# Patient Record
Sex: Female | Born: 1975 | Hispanic: No | Marital: Married | Smoking: Never smoker
Health system: Southern US, Community
[De-identification: ages and names within clinical notes are randomized; demographics above are authoritative.]

## PROBLEM LIST (undated history)

## (undated) DIAGNOSIS — R200 Anesthesia of skin: Secondary | ICD-10-CM

## (undated) DIAGNOSIS — R42 Dizziness and giddiness: Secondary | ICD-10-CM

## (undated) DIAGNOSIS — F32A Depression, unspecified: Secondary | ICD-10-CM

## (undated) DIAGNOSIS — R202 Paresthesia of skin: Secondary | ICD-10-CM

## (undated) DIAGNOSIS — R55 Syncope and collapse: Principal | ICD-10-CM

## (undated) DIAGNOSIS — E230 Hypopituitarism: Secondary | ICD-10-CM

## (undated) DIAGNOSIS — I959 Hypotension, unspecified: Secondary | ICD-10-CM

## (undated) DIAGNOSIS — F329 Major depressive disorder, single episode, unspecified: Secondary | ICD-10-CM

## (undated) DIAGNOSIS — R0602 Shortness of breath: Secondary | ICD-10-CM

## (undated) DIAGNOSIS — R092 Respiratory arrest: Secondary | ICD-10-CM

## (undated) HISTORY — DX: Hypopituitarism: E23.0

---

## 2010-04-09 ENCOUNTER — Ambulatory Visit: Payer: Self-pay | Admitting: Oncology

## 2010-04-10 LAB — MORPHOLOGY: PLT EST: DECREASED

## 2010-04-10 LAB — COMPREHENSIVE METABOLIC PANEL
ALT: 10 U/L (ref 0–35)
AST: 15 U/L (ref 0–37)
Alkaline Phosphatase: 63 U/L (ref 39–117)
BUN: 5 mg/dL — ABNORMAL LOW (ref 6–23)
Calcium: 7.8 mg/dL — ABNORMAL LOW (ref 8.4–10.5)
Chloride: 106 mEq/L (ref 96–112)
Creatinine, Ser: 0.44 mg/dL (ref 0.40–1.20)
Potassium: 3.6 mEq/L (ref 3.5–5.3)

## 2010-04-10 LAB — CBC & DIFF AND RETIC
Basophils Absolute: 0 10*3/uL (ref 0.0–0.1)
Eosinophils Absolute: 0 10*3/uL (ref 0.0–0.5)
HGB: 10 g/dL — ABNORMAL LOW (ref 11.6–15.9)
LYMPH%: 22.8 % (ref 14.0–49.7)
MCV: 81.4 fL (ref 79.5–101.0)
MONO#: 0.5 10*3/uL (ref 0.1–0.9)
NEUT#: 3.9 10*3/uL (ref 1.5–6.5)
Platelets: 82 10*3/uL — ABNORMAL LOW (ref 145–400)
RBC: 3.71 10*6/uL (ref 3.70–5.45)
RDW: 13.8 % (ref 11.2–14.5)
Retic %: 2.21 % — ABNORMAL HIGH (ref 0.50–1.50)
Retic Ct Abs: 81.99 10*3/uL — ABNORMAL HIGH (ref 18.30–72.70)
WBC: 5.8 10*3/uL (ref 3.9–10.3)

## 2010-04-10 LAB — FOLATE: Folate: >20 ng/mL

## 2010-04-10 LAB — IRON AND TIBC
Iron: 36 ug/dL — ABNORMAL LOW (ref 42–145)
UIBC: 490 ug/dL

## 2010-04-10 LAB — VITAMIN B12: Vitamin B-12: 268 pg/mL (ref 211–911)

## 2010-04-23 LAB — CBC WITH DIFFERENTIAL/PLATELET
BASO%: 0.1 % (ref 0.0–2.0)
Eosinophils Absolute: 0.1 10*3/uL (ref 0.0–0.5)
LYMPH%: 20.8 % (ref 14.0–49.7)
MCHC: 32.3 g/dL (ref 31.5–36.0)
MONO#: 0.8 10*3/uL (ref 0.1–0.9)
MONO%: 10.2 % (ref 0.0–14.0)
NEUT#: 5 10*3/uL (ref 1.5–6.5)
Platelets: 81 10*3/uL — ABNORMAL LOW (ref 145–400)
RBC: 3.57 10*6/uL — ABNORMAL LOW (ref 3.70–5.45)
RDW: 14.2 % (ref 11.2–14.5)
WBC: 7.4 10*3/uL (ref 3.9–10.3)
nRBC: 0 % (ref 0–0)

## 2010-05-07 LAB — COMPREHENSIVE METABOLIC PANEL
ALT: 9 U/L (ref 0–35)
AST: 14 U/L (ref 0–37)
Albumin: 3.3 g/dL — ABNORMAL LOW (ref 3.5–5.2)
Alkaline Phosphatase: 74 U/L (ref 39–117)
BUN: 6 mg/dL (ref 6–23)
Calcium: 7.8 mg/dL — ABNORMAL LOW (ref 8.4–10.5)
Chloride: 106 mEq/L (ref 96–112)
Potassium: 4.3 mEq/L (ref 3.5–5.3)
Sodium: 137 mEq/L (ref 135–145)

## 2010-05-07 LAB — CBC WITH DIFFERENTIAL/PLATELET
BASO%: 0.2 % (ref 0.0–2.0)
EOS%: 1.1 % (ref 0.0–7.0)
HCT: 29.3 % — ABNORMAL LOW (ref 34.8–46.6)
MCH: 25.7 pg (ref 25.1–34.0)
MCHC: 32.1 g/dL (ref 31.5–36.0)
MONO#: 0.6 10*3/uL (ref 0.1–0.9)
RBC: 3.66 10*6/uL — ABNORMAL LOW (ref 3.70–5.45)
RDW: 14.4 % (ref 11.2–14.5)
WBC: 5.7 10*3/uL (ref 3.9–10.3)
lymph#: 1.6 10*3/uL (ref 0.9–3.3)
nRBC: 0 % (ref 0–0)

## 2010-05-07 LAB — HOLD TUBE, BLOOD BANK

## 2010-05-29 ENCOUNTER — Ambulatory Visit: Payer: Self-pay | Admitting: Oncology

## 2010-06-03 LAB — CBC WITH DIFFERENTIAL/PLATELET
Basophils Absolute: 0 10*3/uL (ref 0.0–0.1)
Eosinophils Absolute: 0.1 10*3/uL (ref 0.0–0.5)
HCT: 30.4 % — ABNORMAL LOW (ref 34.8–46.6)
HGB: 9.8 g/dL — ABNORMAL LOW (ref 11.6–15.9)
LYMPH%: 23.2 % (ref 14.0–49.7)
MCV: 79.6 fL (ref 79.5–101.0)
MONO#: 0.6 10*3/uL (ref 0.1–0.9)
MONO%: 9.3 % (ref 0.0–14.0)
NEUT#: 4.4 10*3/uL (ref 1.5–6.5)
Platelets: 78 10*3/uL — ABNORMAL LOW (ref 145–400)

## 2010-06-03 LAB — COMPREHENSIVE METABOLIC PANEL
CO2: 21 mEq/L (ref 19–32)
Calcium: 8 mg/dL — ABNORMAL LOW (ref 8.4–10.5)
Chloride: 106 mEq/L (ref 96–112)
Creatinine, Ser: 0.36 mg/dL — ABNORMAL LOW (ref 0.40–1.20)
Glucose, Bld: 84 mg/dL (ref 70–99)
Total Bilirubin: 0.3 mg/dL (ref 0.3–1.2)
Total Protein: 5.9 g/dL — ABNORMAL LOW (ref 6.0–8.3)

## 2010-06-03 LAB — LACTATE DEHYDROGENASE: LDH: 127 U/L (ref 94–250)

## 2010-06-17 LAB — CBC WITH DIFFERENTIAL/PLATELET
BASO%: 0.1 % (ref 0.0–2.0)
EOS%: 0.4 % (ref 0.0–7.0)
HCT: 32.3 % — ABNORMAL LOW (ref 34.8–46.6)
MCHC: 31.9 g/dL (ref 31.5–36.0)
MONO#: 0.6 10*3/uL (ref 0.1–0.9)
NEUT%: 68.6 % (ref 38.4–76.8)
RDW: 15.9 % — ABNORMAL HIGH (ref 11.2–14.5)
WBC: 7.2 10*3/uL (ref 3.9–10.3)
lymph#: 1.6 10*3/uL (ref 0.9–3.3)
nRBC: 0 % (ref 0–0)

## 2010-06-17 LAB — LACTATE DEHYDROGENASE: LDH: 131 U/L (ref 94–250)

## 2010-06-23 ENCOUNTER — Inpatient Hospital Stay (HOSPITAL_COMMUNITY): Admission: RE | Admit: 2010-06-23 | Discharge: 2010-06-26 | Payer: Self-pay | Admitting: Obstetrics and Gynecology

## 2010-06-23 DIAGNOSIS — O24419 Gestational diabetes mellitus in pregnancy, unspecified control: Secondary | ICD-10-CM

## 2010-08-18 ENCOUNTER — Ambulatory Visit: Payer: Self-pay | Admitting: Oncology

## 2010-08-20 LAB — COMPREHENSIVE METABOLIC PANEL
ALT: 26 U/L (ref 0–35)
AST: 21 U/L (ref 0–37)
Albumin: 4 g/dL (ref 3.5–5.2)
CO2: 22 mEq/L (ref 19–32)
Calcium: 8.9 mg/dL (ref 8.4–10.5)
Chloride: 106 mEq/L (ref 96–112)
Potassium: 3.9 mEq/L (ref 3.5–5.3)
Total Protein: 6.9 g/dL (ref 6.0–8.3)

## 2010-08-20 LAB — CBC WITH DIFFERENTIAL/PLATELET
BASO%: 0.5 % (ref 0.0–2.0)
EOS%: 2.2 % (ref 0.0–7.0)
HCT: 34.3 % — ABNORMAL LOW (ref 34.8–46.6)
HGB: 12 g/dL (ref 11.6–15.9)
MCH: 27.8 pg (ref 25.1–34.0)
MCHC: 34.9 g/dL (ref 31.5–36.0)
MONO#: 0.3 10*3/uL (ref 0.1–0.9)
RDW: 16 % — ABNORMAL HIGH (ref 11.2–14.5)
WBC: 3.4 10*3/uL — ABNORMAL LOW (ref 3.9–10.3)
lymph#: 1.6 10*3/uL (ref 0.9–3.3)

## 2010-12-11 LAB — CBC
Hemoglobin: 10.4 g/dL — ABNORMAL LOW (ref 12.0–15.0)
Hemoglobin: 10.6 g/dL — ABNORMAL LOW (ref 12.0–15.0)
MCH: 26.8 pg (ref 26.0–34.0)
MCH: 26.9 pg (ref 26.0–34.0)
MCH: 27.3 pg (ref 26.0–34.0)
MCHC: 33.7 g/dL (ref 30.0–36.0)
MCHC: 33.7 g/dL (ref 30.0–36.0)
MCHC: 33.8 g/dL (ref 30.0–36.0)
MCHC: 33.9 g/dL (ref 30.0–36.0)
MCV: 79.7 fL (ref 78.0–100.0)
MCV: 80.2 fL (ref 78.0–100.0)
Platelets: 66 10*3/uL — ABNORMAL LOW (ref 150–400)
Platelets: 70 10*3/uL — ABNORMAL LOW (ref 150–400)
Platelets: 79 10*3/uL — ABNORMAL LOW (ref 150–400)
RDW: 16.4 % — ABNORMAL HIGH (ref 11.5–15.5)
RDW: 16.5 % — ABNORMAL HIGH (ref 11.5–15.5)
RDW: 16.6 % — ABNORMAL HIGH (ref 11.5–15.5)
WBC: 9.6 10*3/uL (ref 4.0–10.5)

## 2010-12-11 LAB — SURGICAL PCR SCREEN: Staphylococcus aureus: NEGATIVE

## 2010-12-25 ENCOUNTER — Emergency Department (HOSPITAL_COMMUNITY)
Admission: EM | Admit: 2010-12-25 | Discharge: 2010-12-26 | Disposition: A | Discharge status: Home / Self Care | Payer: 59 | Attending: Emergency Medicine | Admitting: Emergency Medicine

## 2010-12-25 DIAGNOSIS — F411 Generalized anxiety disorder: Secondary | ICD-10-CM | POA: Insufficient documentation

## 2010-12-25 DIAGNOSIS — R109 Unspecified abdominal pain: Secondary | ICD-10-CM | POA: Insufficient documentation

## 2010-12-26 LAB — COMPREHENSIVE METABOLIC PANEL
ALT: 28 U/L (ref 0–35)
AST: 22 U/L (ref 0–37)
Albumin: 3.3 g/dL — ABNORMAL LOW (ref 3.5–5.2)
Alkaline Phosphatase: 47 U/L (ref 39–117)
Chloride: 104 mEq/L (ref 96–112)
GFR calc Af Amer: >60 mL/min (ref >60)
Potassium: 3.6 mEq/L (ref 3.5–5.1)
Sodium: 137 mEq/L (ref 135–145)
Total Bilirubin: 0.6 mg/dL (ref 0.3–1.2)
Total Protein: 6.5 g/dL (ref 6.0–8.3)

## 2010-12-26 LAB — GC/CHLAMYDIA PROBE AMP, GENITAL
Chlamydia, DNA Probe: NEGATIVE
GC Probe Amp, Genital: NEGATIVE

## 2010-12-26 LAB — DIFFERENTIAL
Basophils Absolute: 0 10*3/uL (ref 0.0–0.1)
Basophils Relative: 0 % (ref 0–1)
Eosinophils Relative: 1 % (ref 0–5)
Monocytes Absolute: 0.7 10*3/uL (ref 0.1–1.0)
Monocytes Relative: 17 % — ABNORMAL HIGH (ref 3–12)

## 2010-12-26 LAB — WET PREP, GENITAL: Trich, Wet Prep: NONE SEEN

## 2010-12-26 LAB — CBC
MCH: 26.2 pg (ref 26.0–34.0)
MCHC: 33.7 g/dL (ref 30.0–36.0)
RDW: 12.7 % (ref 11.5–15.5)

## 2010-12-26 LAB — URINALYSIS, ROUTINE W REFLEX MICROSCOPIC
Bilirubin Urine: NEGATIVE
Ketones, ur: NEGATIVE mg/dL
Protein, ur: NEGATIVE mg/dL
Urobilinogen, UA: 1 mg/dL (ref 0.0–1.0)

## 2010-12-26 LAB — URINE MICROSCOPIC-ADD ON

## 2011-05-20 ENCOUNTER — Other Ambulatory Visit: Payer: Self-pay | Admitting: Gastroenterology

## 2011-05-20 DIAGNOSIS — R109 Unspecified abdominal pain: Secondary | ICD-10-CM

## 2011-05-22 ENCOUNTER — Ambulatory Visit
Admission: RE | Admit: 2011-05-22 | Discharge: 2011-05-22 | Disposition: A | Discharge status: Home / Self Care | Payer: 59 | Source: Ambulatory Visit | Attending: Gastroenterology | Admitting: Gastroenterology

## 2011-05-22 DIAGNOSIS — R109 Unspecified abdominal pain: Secondary | ICD-10-CM

## 2011-05-22 MED ORDER — IOHEXOL 300 MG/ML  SOLN
100.0000 mL | Freq: Once | INTRAMUSCULAR | Status: AC | PRN
  Administered 2011-05-22: 100 mL via INTRAVENOUS

## 2011-05-28 ENCOUNTER — Other Ambulatory Visit: Payer: Self-pay | Admitting: Gastroenterology

## 2011-06-09 ENCOUNTER — Other Ambulatory Visit: Payer: Self-pay | Admitting: Gastroenterology

## 2011-06-09 ENCOUNTER — Ambulatory Visit
Admission: RE | Admit: 2011-06-09 | Discharge: 2011-06-09 | Disposition: A | Discharge status: Home / Self Care | Payer: 59 | Source: Ambulatory Visit | Attending: Gastroenterology | Admitting: Gastroenterology

## 2011-06-11 ENCOUNTER — Ambulatory Visit
Admission: RE | Admit: 2011-06-11 | Discharge: 2011-06-11 | Disposition: A | Discharge status: Home / Self Care | Payer: 59 | Source: Ambulatory Visit | Attending: Gastroenterology | Admitting: Gastroenterology

## 2011-06-11 ENCOUNTER — Other Ambulatory Visit: Payer: 59

## 2012-05-25 ENCOUNTER — Emergency Department (HOSPITAL_COMMUNITY): Payer: Medicaid Other

## 2012-05-25 ENCOUNTER — Observation Stay (HOSPITAL_COMMUNITY)
Admission: EM | Admit: 2012-05-25 | Discharge: 2012-05-27 | Disposition: A | Discharge status: Home / Self Care | Payer: Medicaid Other | Attending: Internal Medicine | Admitting: Internal Medicine

## 2012-05-25 ENCOUNTER — Encounter (HOSPITAL_COMMUNITY): Payer: Self-pay

## 2012-05-25 DIAGNOSIS — R0602 Shortness of breath: Secondary | ICD-10-CM

## 2012-05-25 DIAGNOSIS — E2749 Other adrenocortical insufficiency: Secondary | ICD-10-CM | POA: Insufficient documentation

## 2012-05-25 DIAGNOSIS — W19XXXA Unspecified fall, initial encounter: Secondary | ICD-10-CM | POA: Insufficient documentation

## 2012-05-25 DIAGNOSIS — M25519 Pain in unspecified shoulder: Secondary | ICD-10-CM | POA: Insufficient documentation

## 2012-05-25 DIAGNOSIS — R202 Paresthesia of skin: Secondary | ICD-10-CM

## 2012-05-25 DIAGNOSIS — I959 Hypotension, unspecified: Secondary | ICD-10-CM

## 2012-05-25 DIAGNOSIS — E876 Hypokalemia: Secondary | ICD-10-CM | POA: Diagnosis present

## 2012-05-25 DIAGNOSIS — E2740 Unspecified adrenocortical insufficiency: Secondary | ICD-10-CM | POA: Diagnosis present

## 2012-05-25 DIAGNOSIS — M79609 Pain in unspecified limb: Secondary | ICD-10-CM | POA: Insufficient documentation

## 2012-05-25 DIAGNOSIS — I079 Rheumatic tricuspid valve disease, unspecified: Secondary | ICD-10-CM | POA: Insufficient documentation

## 2012-05-25 DIAGNOSIS — R9431 Abnormal electrocardiogram [ECG] [EKG]: Secondary | ICD-10-CM | POA: Diagnosis present

## 2012-05-25 DIAGNOSIS — Y92009 Unspecified place in unspecified non-institutional (private) residence as the place of occurrence of the external cause: Secondary | ICD-10-CM | POA: Insufficient documentation

## 2012-05-25 DIAGNOSIS — I951 Orthostatic hypotension: Secondary | ICD-10-CM | POA: Diagnosis present

## 2012-05-25 DIAGNOSIS — R092 Respiratory arrest: Secondary | ICD-10-CM

## 2012-05-25 DIAGNOSIS — R200 Anesthesia of skin: Secondary | ICD-10-CM

## 2012-05-25 DIAGNOSIS — R55 Syncope and collapse: Principal | ICD-10-CM | POA: Diagnosis present

## 2012-05-25 DIAGNOSIS — R42 Dizziness and giddiness: Secondary | ICD-10-CM

## 2012-05-25 HISTORY — DX: Paresthesia of skin: R20.2

## 2012-05-25 HISTORY — DX: Respiratory arrest: R09.2

## 2012-05-25 HISTORY — DX: Dizziness and giddiness: R42

## 2012-05-25 HISTORY — DX: Shortness of breath: R06.02

## 2012-05-25 HISTORY — DX: Hypotension, unspecified: I95.9

## 2012-05-25 HISTORY — DX: Paresthesia of skin: R20.0

## 2012-05-25 HISTORY — DX: Syncope and collapse: R55

## 2012-05-25 HISTORY — DX: Anesthesia of skin: R20.0

## 2012-05-25 LAB — CBC WITH DIFFERENTIAL/PLATELET
Basophils Relative: 1 % (ref 0–1)
HCT: 37.4 % (ref 36.0–46.0)
Hemoglobin: 12.7 g/dL (ref 12.0–15.0)
Lymphs Abs: 0.9 10*3/uL (ref 0.7–4.0)
MCH: 26.6 pg (ref 26.0–34.0)
MCHC: 34 g/dL (ref 30.0–36.0)
Monocytes Absolute: 0.3 10*3/uL (ref 0.1–1.0)
Monocytes Relative: 6 % (ref 3–12)
Neutro Abs: 4.4 10*3/uL (ref 1.7–7.7)
Neutrophils Relative %: 77 % (ref 43–77)
RBC: 4.77 MIL/uL (ref 3.87–5.11)

## 2012-05-25 LAB — COMPREHENSIVE METABOLIC PANEL
Albumin: 3.7 g/dL (ref 3.5–5.2)
Alkaline Phosphatase: 77 U/L (ref 39–117)
BUN: 8 mg/dL (ref 6–23)
CO2: 21 mEq/L (ref 19–32)
Chloride: 102 mEq/L (ref 96–112)
Creatinine, Ser: 0.41 mg/dL — ABNORMAL LOW (ref 0.50–1.10)
GFR calc Af Amer: >90 mL/min (ref >90)
GFR calc non Af Amer: >90 mL/min (ref >90)
Glucose, Bld: 94 mg/dL (ref 70–99)
Potassium: 3.4 mEq/L — ABNORMAL LOW (ref 3.5–5.1)
Total Bilirubin: 0.3 mg/dL (ref 0.3–1.2)

## 2012-05-25 LAB — CBC
HCT: 37.5 % (ref 36.0–46.0)
Hemoglobin: 12.4 g/dL (ref 12.0–15.0)
MCHC: 33.1 g/dL (ref 30.0–36.0)
MCV: 78.1 fL (ref 78.0–100.0)
WBC: 6.8 10*3/uL (ref 4.0–10.5)

## 2012-05-25 LAB — POCT I-STAT TROPONIN I: Troponin i, poc: 0 ng/mL (ref 0.00–0.08)

## 2012-05-25 LAB — URINALYSIS, ROUTINE W REFLEX MICROSCOPIC
Glucose, UA: NEGATIVE mg/dL
Specific Gravity, Urine: 1.011 (ref 1.005–1.030)
pH: 6.5 (ref 5.0–8.0)

## 2012-05-25 LAB — URINE MICROSCOPIC-ADD ON

## 2012-05-25 LAB — CREATININE, SERUM
GFR calc Af Amer: >90 mL/min (ref >90)
GFR calc non Af Amer: >90 mL/min (ref >90)

## 2012-05-25 MED ORDER — OXYCODONE HCL 5 MG PO TABS: 5.0000 mg | ORAL_TABLET | ORAL | Status: DC | PRN

## 2012-05-25 MED ORDER — SODIUM CHLORIDE 0.9 % IV BOLUS (SEPSIS)
1000.0000 mL | Freq: Once | INTRAVENOUS | Status: AC
  Administered 2012-05-25: 1000 mL via INTRAVENOUS

## 2012-05-25 MED ORDER — ONDANSETRON HCL 4 MG PO TABS: 4.0000 mg | ORAL_TABLET | Freq: Four times a day (QID) | ORAL | Status: DC | PRN

## 2012-05-25 MED ORDER — SODIUM CHLORIDE 0.9 % IV SOLN
INTRAVENOUS | Status: DC
  Administered 2012-05-25 – 2012-05-26 (×3): via INTRAVENOUS

## 2012-05-25 MED ORDER — MORPHINE SULFATE 2 MG/ML IJ SOLN: 2.0000 mg | INTRAMUSCULAR | Status: DC | PRN

## 2012-05-25 MED ORDER — ACETAMINOPHEN 650 MG RE SUPP: 650.0000 mg | Freq: Four times a day (QID) | RECTAL | Status: DC | PRN

## 2012-05-25 MED ORDER — ACETAMINOPHEN 325 MG PO TABS
650.0000 mg | ORAL_TABLET | Freq: Four times a day (QID) | ORAL | Status: DC | PRN
  Administered 2012-05-25 – 2012-05-26 (×2): 650 mg via ORAL
  Filled 2012-05-25 (×2): qty 2

## 2012-05-25 MED ORDER — SODIUM CHLORIDE 0.9 % IJ SOLN
3.0000 mL | Freq: Two times a day (BID) | INTRAMUSCULAR | Status: DC
  Administered 2012-05-25: 3 mL via INTRAVENOUS

## 2012-05-25 MED ORDER — HEPARIN SODIUM (PORCINE) 5000 UNIT/ML IJ SOLN
5000.0000 [IU] | Freq: Three times a day (TID) | INTRAMUSCULAR | Status: DC
  Administered 2012-05-25 – 2012-05-27 (×5): 5000 [IU] via SUBCUTANEOUS
  Filled 2012-05-25 (×8): qty 1

## 2012-05-25 MED ORDER — ONDANSETRON HCL 4 MG/2ML IJ SOLN: 4.0000 mg | Freq: Four times a day (QID) | INTRAMUSCULAR | Status: DC | PRN

## 2012-05-25 MED ORDER — POTASSIUM CHLORIDE CRYS ER 20 MEQ PO TBCR
40.0000 meq | EXTENDED_RELEASE_TABLET | Freq: Once | ORAL | Status: AC
  Administered 2012-05-25: 40 meq via ORAL
  Filled 2012-05-25: qty 2

## 2012-05-25 NOTE — ED Notes (Signed)
Last night syncope witnessed by husband for 10 seconds sitting in chair with right upper arm pain. Seen at [facility] office for right arm pain.  Doctor gave 60mg  Toradol IM and syncope event after.  Doctor gave 0.2 mg Epinephrine IM and dexamethasone 2mg  IM.  Sent to ED for evaluation.  Patient speaks arabic per EMS and only states "I don't speak english"  Patient is alert per EMS and following commands.

## 2012-05-25 NOTE — ED Notes (Signed)
Admitting doctor here to see 

## 2012-05-25 NOTE — ED Notes (Signed)
Pt speaks Arabic, phone interpreter provided. Husband at pt's bedside. While using the interpreter, pt hands phone to husband who then states "the interpreter doesn't speak Arabic that she can understand." Phone call abruptly ended. Husband states he can serve as Equities trader.

## 2012-05-25 NOTE — ED Notes (Signed)
Patient leaving for x-ray stated needs to use bathroom first. Will go to x-ray after. Husband at bedside side translated for patient and patient nodded head up and down is needed to use bathroom

## 2012-05-25 NOTE — ED Notes (Signed)
Via husband as pt interpreter: Pt yesterday passed out in a chair for appx 10 min. Denies injury. After regaining consciousness states that her right shoulder and arm are hurting describing it as  "sharp pain, 8/10." Pt then when to PCP this am and while receiving a shot she passed out. Pt states that this happened before 2 years ago. C/o generalized weakness and right arm pain on movement.

## 2012-05-25 NOTE — ED Notes (Signed)
Pt able to complete orthostatics. Pt unsteady and swaying upon standing, but able to complete orthostatic VS with assistance from husband.

## 2012-05-25 NOTE — H&P (Signed)
PCP:   No primary provider on file.   Chief Complaint:  Recurrent syncope.   HPI: This is a 36 year old female, with no significant past medical history. Patient speaks only Arabic, so initial history was gleaned from ED MD, and subsequently obtained from patient and spouse, who speaks good [place]. It appears that patient was in her usual state of health until about 11:00 AM on 05/24/12, when the patient became dizzy while sitting down in a chair, and according to the husband, was unresponsive for possibly 10 minutes. She slid to the floor and had no involuntary movements. Patient denies antecedent chest pain , shortness of breath or palpitations. She has since complained of right arm pain. She went to see her primary MD today, and while in the Dr's office, at about 2:00 PM, received a shot of IM Toradol, 60 mg and had a syncopal event after. PMD gave patient 0.2 mg Epinephrine IM and Dexamethasone 2mg  IM, then referred her to to ED for evaluation. According to patient and spouse, she had a syncopal episode about 2 years ago, this time, while standing, and fell at that time. She recovered uneventfully after 10 minutes, but did not see a doctor.  .    Allergies:  No Known Allergies   History reviewed. No pertinent past medical history. Had  Caesarian section x 2.   History reviewed. No pertinent past surgical history.  Prior to Admission medications   Not on File    Social History: Patient does not have a smoking history on file. She does not have any smokeless tobacco history on file. Her alcohol and drug histories not on file. She is married with 2 offspring.   Family History: Both parents are alive and well. Mother is aged 85 years, while father is aged 76 years.  Review of Systems:  As per HPI and chief complaint. Patent denies fatigue, diminished appetite, weight loss, fever, chills, headache, blurred vision, difficulty in speaking, dysphagia, chest pain, cough, shortness of breath,  orthopnea, paroxysmal nocturnal dyspnea, nausea, diaphoresis, abdominal pain, vomiting, diarrhea, belching, heartburn, hematemesis, melena, dysuria, nocturia, urinary frequency, hematochezia, lower extremity swelling, pain, or redness. The rest of the systems review is negative.  Physical Exam:  General:  Patient does not appear to be in obvious acute distress. Alert, communicative, fully oriented, talking in complete sentences, not short of breath at rest.  HEENT:  No clinical pallor, no jaundice, no conjunctival injection or discharge. NECK:  Supple, JVP not seen, no carotid bruits, no palpable lymphadenopathy, no palpable goiter. CHEST:  Clinically clear to auscultation, no wheezes, no crackles. HEART:  Sounds 1 and 2 heard, normal, regular, no murmurs. ABDOMEN:  Full, soft, non-tender, no palpable organomegaly, no palpable masses, normal bowel sounds. GENITALIA:  Not examined. LOWER EXTREMITIES:  No pitting edema, palpable peripheral pulses. MUSCULOSKELETAL SYSTEM:  Unremarkable. CENTRAL NERVOUS SYSTEM:  No focal neurologic deficit on gross examination.  Labs on Admission:  Results for orders placed during the hospital encounter of 05/25/12 (from the past 48 hour(s))  CBC WITH DIFFERENTIAL     Status: Normal   Collection Time   05/25/12  3:50 PM      Component Value Range Comment   WBC 5.7  4.0 - 10.5 K/uL    RBC 4.77  3.87 - 5.11 MIL/uL    Hemoglobin 12.7  12.0 - 15.0 g/dL    HCT 16.1  09.6 - 04.5 %    MCV 78.4  78.0 - 100.0 fL  MCH 26.6  26.0 - 34.0 pg    MCHC 34.0  30.0 - 36.0 g/dL    RDW 54.0  98.1 - 19.1 %    Platelets 171  150 - 400 K/uL    Neutrophils Relative 77  43 - 77 %    Neutro Abs 4.4  1.7 - 7.7 K/uL    Lymphocytes Relative 16  12 - 46 %    Lymphs Abs 0.9  0.7 - 4.0 K/uL    Monocytes Relative 6  3 - 12 %    Monocytes Absolute 0.3  0.1 - 1.0 K/uL    Eosinophils Relative 1  0 - 5 %    Eosinophils Absolute 0.0  0.0 - 0.7 K/uL    Basophils Relative 1  0 - 1 %     Basophils Absolute 0.0  0.0 - 0.1 K/uL   COMPREHENSIVE METABOLIC PANEL     Status: Abnormal   Collection Time   05/25/12  3:50 PM      Component Value Range Comment   Sodium 137  135 - 145 mEq/L    Potassium 3.4 (*) 3.5 - 5.1 mEq/L    Chloride 102  96 - 112 mEq/L    CO2 21  19 - 32 mEq/L    Glucose, Bld 94  70 - 99 mg/dL    BUN 8  6 - 23 mg/dL    Creatinine, Ser 4.78 (*) 0.50 - 1.10 mg/dL    Calcium 9.2  8.4 - 29.5 mg/dL    Total Protein 8.0  6.0 - 8.3 g/dL    Albumin 3.7  3.5 - 5.2 g/dL    AST 19  0 - 37 U/L    ALT 18  0 - 35 U/L    Alkaline Phosphatase 77  39 - 117 U/L    Total Bilirubin 0.3  0.3 - 1.2 mg/dL    GFR calc non Af Amer >90  >90 mL/min    GFR calc Af Amer >90  >90 mL/min   URINALYSIS, ROUTINE W REFLEX MICROSCOPIC     Status: Abnormal   Collection Time   05/25/12  4:25 PM      Component Value Range Comment   Color, Urine YELLOW  YELLOW    APPearance HAZY (*) CLEAR    Specific Gravity, Urine 1.011  1.005 - 1.030    pH 6.5  5.0 - 8.0    Glucose, UA NEGATIVE  NEGATIVE mg/dL    Hgb urine dipstick TRACE (*) NEGATIVE    Bilirubin Urine NEGATIVE  NEGATIVE    Ketones, ur NEGATIVE  NEGATIVE mg/dL    Protein, ur NEGATIVE  NEGATIVE mg/dL    Urobilinogen, UA 0.2  0.0 - 1.0 mg/dL    Nitrite NEGATIVE  NEGATIVE    Leukocytes, UA TRACE (*) NEGATIVE   URINE MICROSCOPIC-ADD ON     Status: Abnormal   Collection Time   05/25/12  4:25 PM      Component Value Range Comment   Squamous Epithelial / LPF FEW (*) RARE    WBC, UA 0-2  <3 WBC/hpf    RBC / HPF 0-2  <3 RBC/hpf    Bacteria, UA RARE  RARE   POCT I-STAT TROPONIN I     Status: Normal   Collection Time   05/25/12  4:34 PM      Component Value Range Comment   Troponin i, poc 0.00  0.00 - 0.08 ng/mL    Comment 3  POCT PREGNANCY, URINE     Status: Normal   Collection Time   05/25/12  4:55 PM      Component Value Range Comment   Preg Test, Ur NEGATIVE  NEGATIVE     Radiological Exams on Admission: *RADIOLOGY  REPORT*  Clinical Data: Syncopal episode. Pain.  CT HEAD WITHOUT CONTRAST  Technique: Contiguous axial images were obtained from the base of  the skull through the vertex without contrast.  Comparison: None.  Findings: No acute intracranial abnormality is present.  Specifically, there is no evidence for acute infarct, hemorrhage,  mass, hydrocephalus, or extra-axial fluid collection. The  paranasal sinuses and mastoid air cells are clear. The globes and  orbits are intact. The osseous skull is intact.  IMPRESSION:  Negative CT of the head.  Original Report Authenticated By: Jamesetta Orleans. MATTERN, M.D.  *RADIOLOGY REPORT*  Clinical Data: History of trauma with injury from fall with pain.  RIGHT HUMERUS - 2+ VIEW  Comparison: Right shoulder examination.  Findings: Alignment is normal. Joint spaces are preserved. No  fracture or dislocation is evident. No soft tissue lesions are  seen.  IMPRESSION:  No fracture dislocation is evident.  Original Report Authenticated By: Crawford Givens, M.D.    Assessment/Plan Active Problems: 1. Syncope: patient suffered a syncopal episode today, which appears to be clearly vaso-vagal. She admits to having been very anxious prior to receiving the Toradol injection at her PMD's office. Recovery was quick, and she appears to have suffered no deleterious effects. More concerning is her syncopal episode on 05/24/12. Etiology is unclear at this time, and it occurred while patient was in a sitting position. She had no invulontary movements during episode and recovered spontaneously on recumbency, without obvious post-ictal symptoms. Will admit for telemetric monitoring and observation, and work up with Carotid dopplers, and 2D Echocardiogram, particularly, as patient had a similar episode 2 years ago. Head CT scan is negative for acute findings. Will also do EEG.  2. Orthostasis: Patient reportedly, had postural dizziness. Hydration status appears satisfactory at  this time, but SBP is borderline at 106. Will manage with iv fluids, and check orthostatic blood pressure measurements.  3. Abnormal finding on EKG: Patient's EKG shows SR, but concave upward ST elevation is also apparent in multiple leads, raising suspicion for pericardial effusion. This will be evaluated with 2D Echocardiogram. Meanwhile, will cycle cardiac enzymes.  4. Hypokalemia: Patient presented with a hypokalemia of 3.4 She is not on any culprit medication. We shall check random and AM cortisol levels, but replete potassium, as indicated.   Further management will depend on clinical course.   Code Status: Full Code.    Time Spent on Admission: 45 mins.   Geovanna Simko,CHRISTOPHER 05/25/2012, 7:21 PM

## 2012-05-25 NOTE — ED Provider Notes (Addendum)
History     CSN: [id number]  Arrival date & time 05/25/12  1511   First MD Initiated Contact with Patient 05/25/12 1612      Chief Complaint  Patient presents with  . Loss of Consciousness  . Fall    (Consider location/radiation/quality/duration/timing/severity/associated sxs/prior treatment) Patient is a 36 y.o. female presenting with syncope and fall. The history is provided by the patient and the spouse.  Loss of Consciousness  Fall   patient here after having syncopal episodes x2. Personal was yesterday when the patient was sitting down and became dizzy and according to the husband was unresponsive for possibly 2 minutes. No postictal period no seizure like activity. She denied any prodrome of chest pain or shortness of breath. No recent abdominal pain or change in bowel function. Patient went to her physician today and complained of right arm pain as a result of the fall from yesterday. She  Was given Toradol for pain and patient had another syncopal event. Was some concern about allergic reaction patient was given epinephrine Benadryl. As well as dexamethasone and sent her for evaluation.  History reviewed. No pertinent past medical history.  History reviewed. No pertinent past surgical history.  No family history on file.  History  Substance Use Topics  . Smoking status: Not on file  . Smokeless tobacco: Not on file  . Alcohol Use: Not on file    OB History    Grav Para Term Preterm Abortions TAB SAB Ect Mult Living                  Review of Systems  Cardiovascular: Positive for syncope.  All other systems reviewed and are negative.    Allergies  Review of patient's allergies indicates no known allergies.  Home Medications  No current outpatient prescriptions on file.  BP 106/78  Pulse 90  Temp 98.3 F (36.8 C) (Oral)  Resp 24  SpO2 100%  Physical Exam  Nursing note and vitals reviewed. Constitutional: She is oriented to person, place, and time.  She appears well-developed and well-nourished.  Non-toxic appearance. No distress.  HENT:  Head: Normocephalic and atraumatic.  Eyes: Conjunctivae, EOM and lids are normal. Pupils are equal, round, and reactive to light.  Neck: Normal range of motion. Neck supple. No tracheal deviation present. No mass present.  Cardiovascular: Normal rate, regular rhythm and normal heart sounds.  Exam reveals no gallop.   No murmur heard. Pulmonary/Chest: Effort normal and breath sounds normal. No stridor. No respiratory distress. She has no decreased breath sounds. She has no wheezes. She has no rhonchi. She has no rales.  Abdominal: Soft. Normal appearance and bowel sounds are normal. She exhibits no distension. There is no tenderness. There is no rebound and no CVA tenderness.  Musculoskeletal: Normal range of motion. She exhibits no edema and no tenderness.       Arms: Neurological: She is alert and oriented to person, place, and time. She has normal strength. No cranial nerve deficit or sensory deficit. GCS eye subscore is 4. GCS verbal subscore is 5. GCS motor subscore is 6.  Skin: Skin is warm and dry. No abrasion and no rash noted.  Psychiatric: She has a normal mood and affect. Her speech is normal and behavior is normal.    ED Course  Procedures (including critical care time)   Labs Reviewed  CBC WITH DIFFERENTIAL  COMPREHENSIVE METABOLIC PANEL   No results found.   No diagnosis found.    MDM  Date: 05/25/2012  Rate: **87*  Rhythm: normal sinus rhythm  QRS Axis: normal  Intervals: normal  ST/T Wave abnormalities: normal  Conduction Disutrbances:none  Narrative Interpretation:   Old EKG Reviewed: unchanged  5:48 PM Patient has been offered admission for evaluation of her syncope and she has refused at this time. Husband agrees and also does not wish the patient to be admitted. The interaction between them has been appropriate. An interpreter phone had been using initially but the  husband states that they speak a different dialect. No concern for abuse at this time. Patient seems to understand through her husband the risk of leaving AGAINST MEDICAL ADVICE and they both accept this    6:32 PM Patient has changed her mind and now white female to the hospital. I spoke with hospitalist and they will come see the patient    Toy Baker, MD 05/25/12 1751  Toy Baker, MD 05/25/12 [id number]

## 2012-05-25 NOTE — ED Notes (Addendum)
Spoke with social work concerning varied story of events yesterday. Also discussed interpretor occurrence. Concerned about patient safety. Spoke with EMD and plan is to admit pt. Social worker to assess pt during admission. Pt is calm and cooperative when speaking with husband and staff members.

## 2012-05-25 NOTE — ED Notes (Signed)
EDP spoke with pt and husband at great length about plan of care. EDP states that pt and husband are both cooperating, acting appropriately and concerned of care for pt and in agreement about plan of care. Pt and pts husband will sign out AMA and will follow up as needed.

## 2012-05-25 NOTE — ED Notes (Signed)
Spoke with patient and husband about AMA decided to stay overnight EDP notified.

## 2012-05-26 ENCOUNTER — Observation Stay (HOSPITAL_COMMUNITY): Payer: Medicaid Other

## 2012-05-26 DIAGNOSIS — E876 Hypokalemia: Secondary | ICD-10-CM

## 2012-05-26 DIAGNOSIS — R55 Syncope and collapse: Principal | ICD-10-CM

## 2012-05-26 DIAGNOSIS — R9431 Abnormal electrocardiogram [ECG] [EKG]: Secondary | ICD-10-CM

## 2012-05-26 DIAGNOSIS — I951 Orthostatic hypotension: Secondary | ICD-10-CM

## 2012-05-26 LAB — COMPREHENSIVE METABOLIC PANEL
ALT: 15 U/L (ref 0–35)
Albumin: 3.3 g/dL — ABNORMAL LOW (ref 3.5–5.2)
Calcium: 8.9 mg/dL (ref 8.4–10.5)
GFR calc Af Amer: >90 mL/min (ref >90)
Glucose, Bld: 119 mg/dL — ABNORMAL HIGH (ref 70–99)
Sodium: 137 mEq/L (ref 135–145)
Total Protein: 7.2 g/dL (ref 6.0–8.3)

## 2012-05-26 LAB — CBC
Hemoglobin: 11.9 g/dL — ABNORMAL LOW (ref 12.0–15.0)
MCH: 26.1 pg (ref 26.0–34.0)
MCHC: 33.2 g/dL (ref 30.0–36.0)
RDW: 14.1 % (ref 11.5–15.5)

## 2012-05-26 LAB — CORTISOL: Cortisol, Plasma: 1.6 ug/dL

## 2012-05-26 MED ORDER — MECLIZINE HCL 12.5 MG PO TABS
12.5000 mg | ORAL_TABLET | Freq: Three times a day (TID) | ORAL | Status: DC
  Administered 2012-05-26 – 2012-05-27 (×3): 12.5 mg via ORAL
  Filled 2012-05-26 (×6): qty 1

## 2012-05-26 MED ORDER — COSYNTROPIN 0.25 MG IJ SOLR
0.2500 mg | Freq: Once | INTRAMUSCULAR | Status: AC
  Administered 2012-05-26: 0.25 mg via INTRAVENOUS
  Filled 2012-05-26: qty 0.25

## 2012-05-26 MED ORDER — IBUPROFEN 400 MG PO TABS
400.0000 mg | ORAL_TABLET | Freq: Three times a day (TID) | ORAL | Status: DC
  Administered 2012-05-26 – 2012-05-27 (×2): 400 mg via ORAL
  Filled 2012-05-26 (×5): qty 1

## 2012-05-26 MED ORDER — BOOST / RESOURCE BREEZE PO LIQD: 1.0000 | Freq: Every day | ORAL | Status: DC | PRN

## 2012-05-26 NOTE — Progress Notes (Signed)
I spoke with patient and husband about signing out AMA.  Husband interpreted AMA sheet and patient decided to stay until in the morning if the doctor would see her in the AM for discharge.  I sent a text page to Dr. Brien Few to make aware of pt remaining and wanted to be seen in the morning.  I did not get a response but passed on the oncoming RN Marissa information. Pt in room with husband at this time. Thomas Hoff

## 2012-05-26 NOTE — Progress Notes (Signed)
VASCULAR LAB PRELIMINARY  PRELIMINARY  PRELIMINARY  PRELIMINARY  Carotid duplex completed  Preliminary report: No significant ICA stenosis or plaque visualized.  Lindsey Molina, 05/26/2012, 6:11 PM

## 2012-05-26 NOTE — Progress Notes (Signed)
  Echocardiogram 2D Echocardiogram has been performed.  Cathie Beams 05/26/2012, 12:56 PM

## 2012-05-26 NOTE — Progress Notes (Signed)
INITIAL ADULT NUTRITION ASSESSMENT Date: 05/26/2012   Time: 1:59 PM  INTERVENTION:  Resource Breeze supplement daily PRN (250 kcals, 9 gm protein per 8 fl oz carton) RD to follow for nutrition care plan  Reason for Assessment: Malnutrition Screening Tool Report  ASSESSMENT: Female 36 y.o.  Dx: recurrent syncope  Hx:  Past Medical History  Diagnosis Date  . Syncope and collapse 2011; 05/24/2012  . Hypotension 05/25/2012  . Dizziness 05/25/2012    "severe; and I pass out"  . Shortness of breath 05/25/2012    "occurs when I pass out"  . Respiratory arrest 05/25/2012    "occurs when I black out; I can't breath"  . Numbness and tingling 05/25/2012    "and pain right arm when I get mad,  nervous and/or blackout; sometimes; not always"    Related Meds:     . heparin  5,000 Units Subcutaneous Q8H  . potassium chloride  40 mEq Oral Once  . sodium chloride  1,000 mL Intravenous Once  . sodium chloride  3 mL Intravenous Q12H    Ht: 5\' 4"  (162.6 cm)  Wt: 165 lb 3.2 oz (74.934 kg)  Ideal Wt: 54.5 kg % Ideal Wt: 137%  Usual Wt: --- % Usual Wt: ---  Body mass index is 28.36 kg/(m^2).  Food/Nutrition Related Hx: recent weight loss and decreased appetite per admission nutrition screen  Labs:  CMP     Component Value Date/Time   NA 137 05/26/2012 0700   K 3.8 05/26/2012 0700   CL 106 05/26/2012 0700   CO2 20 05/26/2012 0700   GLUCOSE 119* 05/26/2012 0700   BUN 6 05/26/2012 0700   CREATININE 0.41* 05/26/2012 0700   CALCIUM 8.9 05/26/2012 0700   PROT 7.2 05/26/2012 0700   ALBUMIN 3.3* 05/26/2012 0700   AST 15 05/26/2012 0700   ALT 15 05/26/2012 0700   ALKPHOS 67 05/26/2012 0700   BILITOT 0.3 05/26/2012 0700   GFRNONAA >90 05/26/2012 0700   GFRAA >90 05/26/2012 0700     Intake/Output Summary (Last 24 hours) at 05/26/12 1400 Last data filed at 05/26/12 0900  Gross per 24 hour  Intake   1490 ml  Output    950 ml  Net    540 ml    Diet Order: General  Supplements/Tube Feeding:  N/A  IVF:    sodium chloride Last Rate: 125 mL/hr at 05/26/12 0522    Estimated Nutritional Needs:   Kcal: 1800-2000 Protein: 80-90 gm Fluid: 1.8-2.0 L  Patient admitted for recurrent syncope; became dizzy while sitting down in a chair and was unresponsive for approximately 10 minutes (per husband); per husband, patient was only consuming breakfast prior to admission; no recent weight loss reported; requesting a salad for dinner -- RD informed kitchen; PO intake 50% per flowsheet records; would benefit from supplement as needed -- RD to order.  NUTRITION DIAGNOSIS: -Inadequate oral intake (NI-2.1).  Status: Ongoing  RELATED TO: decreased appetite  AS EVIDENCE BY: PO intake 50%  MONITORING/EVALUATION(Goals): Goal: Oral intake with meals & supplements to meet >/= 90% of estimated nutrition needs Monitor: PO & supplemental intake, weight, labs, I/O's  EDUCATION NEEDS: -No education needs identified at this time  Dietitian #: [phone]  DOCUMENTATION CODES Per approved criteria  -Not Applicable    Alger Memos 05/26/2012, 1:59 PM

## 2012-05-26 NOTE — Progress Notes (Addendum)
[facility] PROGRESS NOTE  Lindsey Molina YNW:[id number] DOB: 12-01-1975 DOA: 05/25/2012 PCP: No primary provider on file.  Assessment/Plan: Active Problems:  Syncope  Hypokalemia  Orthostasis  Abnormal finding on EKG    1. Syncope: Patient suffered a syncopal episode on 05/25/12, which appears to be clearly vaso-vagal. She admits to having been very anxious prior to receiving a Toradol injection at her PMD's office. Recovery was quick, and she appears to have suffered no deleterious effects. More concerning is her syncopal episode on 05/24/12. Patient patient had a similar episode 2 years ago. Etiology is unclear at this time, and it occurred while patient was in a sitting position. She had no involuntary movements during episode and recovered spontaneously on recumbency, without obvious post-ictal symptoms. She has had no arrhythmias on telemetry, Head CT scan is negative for acute findings, EEG, Carotid dopplers, and 2D Echocardiogram have been done, and reports are pending.  2. Orthostasis: Patient reportedly, had postural dizziness. Hydration status appears satisfactory at this time, and patient has had no postural changes on orthostatic BP monitoring. Will discontinue iv fluids, today. Patient has had some imbalance and dizziness on attempts to ambulate this AM. Will do Brain MRI to rule out CVA, and start Meclizine, as well as PT/OT.  3. Abnormal finding on EKG: Patient's EKG shows SR, but concave upward ST elevation is also apparent in multiple leads, raising suspicion for pericardial effusion. This will be evaluated with 2D Echocardiogram. Cardiac enzymes are negative so far, and TSH is normal at 1.6  4. Hypokalemia: Patient presented with a hypokalemia of 3.4 She is not on any culprit medication. Repleted as indicated. Random cortisol is low at 1.6. A Cortrosyn test is indicated.  5. Right arm pain: Musculoskeletal, after fall. Fortunately, no bony injuries. Manageing with analgesics.      Code Status: Full Code. Family Communication: Discussed fully with patient and spouse.  Disposition Plan: To be determined. Possible discharge today, if work up is negative.    Brief narrative: This is a 36 year old female, with no significant past medical history. Patient speaks only Arabic, so initial history was gleaned from ED MD, and subsequently obtained from patient and spouse, who speaks good [place]. It appears that patient was in her usual state of health until about 11:00 AM on 05/24/12, when the patient became dizzy while sitting down in a chair, and according to the husband, was unresponsive for possibly 10 minutes. She slid to the floor and had no involuntary movements. Patient denies antecedent chest pain , shortness of breath or palpitations. She has since complained of right arm pain. She went to see her primary MD today, and while in the Dr's office, at about 2:00 PM, received a shot of IM Toradol, 60 mg and had a syncopal event after. PMD gave patient 0.2 mg Epinephrine IM and Dexamethasone 2mg  IM, then referred her to to ED for evaluation. According to patient and spouse, she had a syncopal episode about 2 years ago, this time, while standing, and fell at that time. She recovered uneventfully after 10 minutes, but did not see a doctor.    Consultants:  N/A  Procedures:  Head CT/EEG/Vascular doppler, Echocardiogram.   Antibiotics:  N/A.   HPI/Subjective: Has postural dizziness, per nursing staff. However, no postural hypotension on orthostatics.  Objective: Vital signs in last 24 hours: Temp:  [97.6 F (36.4 C)-98.9 F (37.2 C)] 98.9 F (37.2 C) (08/29 1330) Pulse Rate:  [75-96] 84  (08/29 1335) Resp:  [18-24]  18  (08/29 1330) BP: (103-117)/(70-80) 108/76 mmHg (08/29 1335) SpO2:  [97 %-100 %] 97 % (08/29 1330) Weight:  [74.934 kg (165 lb 3.2 oz)] 74.934 kg (165 lb 3.2 oz) (08/28 2029) Weight change:  Last BM Date: 05/24/12  Intake/Output from previous  day: 08/28 0701 - 08/29 0700 In: 1250 [I.V.:1250] Out: 650 [Urine:650] Total I/O In: 480 [P.O.:480] Out: 500 [Urine:500]   Physical Exam: General:  Alert, communicative, fully oriented, talking in complete sentences, not short of breath at rest.  HEENT: No clinical pallor, no jaundice, no conjunctival injection or discharge.  NECK: Supple, JVP not seen, no carotid bruits, no palpable lymphadenopathy, no palpable goiter.  CHEST: Clinically clear to auscultation, no wheezes, no crackles.  HEART: Sounds 1 and 2 heard, normal, regular, no murmurs.  ABDOMEN: Full, soft, non-tender, no palpable organomegaly, no palpable masses, normal bowel sounds.  GENITALIA: Not examined.  LOWER EXTREMITIES: No pitting edema, palpable peripheral pulses.  MUSCULOSKELETAL SYSTEM: Unremarkable.  CENTRAL NERVOUS SYSTEM: No focal neurologic deficit on gross examination.  Lab Results:  Basename 05/26/12 0700 05/25/12 2000  WBC 7.5 6.8  HGB 11.9* 12.4  HCT 35.8* 37.5  PLT 186 150    Basename 05/26/12 0700 05/25/12 2000 05/25/12 1550  NA 137 -- 137  K 3.8 -- 3.4*  CL 106 -- 102  CO2 20 -- 21  GLUCOSE 119* -- 94  BUN 6 -- 8  CREATININE 0.41* 0.38* --  CALCIUM 8.9 -- 9.2   No results found for this or any previous visit (from the past 240 hour(s)).   Studies/Results: Dg Shoulder Right  05/25/2012  *RADIOLOGY REPORT*  Clinical Data: History of trauma.  History of injury from fall with pain.  RIGHT SHOULDER - 2+ VIEW  Comparison: Right humerus examination.  Findings: Alignment is normal.  Joint spaces are preserved.  No fracture or dislocation is evident.  No soft tissue lesions are seen.  IMPRESSION: No fracture or dislocation is evident.   Original Report Authenticated By: Crawford Givens, M.D.    Ct Head Wo Contrast  05/25/2012  *RADIOLOGY REPORT*  Clinical Data: Syncopal episode.  Pain.  CT HEAD WITHOUT CONTRAST  Technique:  Contiguous axial images were obtained from the base of the skull through the  vertex without contrast.  Comparison: None.  Findings: No acute intracranial abnormality is present. Specifically, there is no evidence for acute infarct, hemorrhage, mass, hydrocephalus, or extra-axial fluid collection.  The paranasal sinuses and mastoid air cells are clear.  The globes and orbits are intact.  The osseous skull is intact.  IMPRESSION: Negative CT of the head.   Original Report Authenticated By: Jamesetta Orleans. MATTERN, M.D.    Dg Humerus Right  05/25/2012  *RADIOLOGY REPORT*  Clinical Data: History of trauma with injury from fall with pain.  RIGHT HUMERUS - 2+ VIEW  Comparison: Right shoulder examination.  Findings: Alignment is normal.  Joint spaces are preserved.  No fracture or dislocation is evident.  No soft tissue lesions are seen.  IMPRESSION: No fracture dislocation is evident.   Original Report Authenticated By: Crawford Givens, M.D.     Medications: Scheduled Meds:   . heparin  5,000 Units Subcutaneous Q8H  . potassium chloride  40 mEq Oral Once  . sodium chloride  1,000 mL Intravenous Once  . sodium chloride  3 mL Intravenous Q12H   Continuous Infusions:   . sodium chloride 125 mL/hr at 05/26/12 0522   PRN Meds:.acetaminophen, acetaminophen, morphine injection, ondansetron (ZOFRAN) IV, ondansetron, oxyCODONE  LOS: 1 day   Marisha Renier,CHRISTOPHER  [facility] Pager [phone]. If 8PM-8AM, please contact night-coverage at [email or web address], password [facility] 05/26/2012, 2:31 PM  LOS: 1 day

## 2012-05-26 NOTE — Procedures (Signed)
EEG:  This is a 36 y/o Arabic female who comes with syncopal episode that involved with dizziness.  An EEG was done to rule out seizures.  EEG: Cadwell system with 17 channels  EEG: Begins with eyes opening there is approximately 8-9 hz alpha rhythm seen bi-hemispherically symmetrical  Photic stimulation was done and did not provide significant driving response to the EEG.  There were no epileptiform discharges or seizure like activity recorded Hyperventilation was not performed.   There is intermittent symmetrical slowing without lateralizing.  Drowsiness was recroded  Clinical interpretation: This is a normal wake, drowsy EEG without evidence of seizures or epileptiform discharges.    Called Dr.  Ricke Hey at 4 PM with the report  Branndon Tuite V-P Eilleen Kempf., MD., Ph.D.,MS 05/26/2012 4:47 PM

## 2012-05-26 NOTE — Progress Notes (Addendum)
Dr. Brien Few called and stated that he talked to pts husband in detail about her care and test. Pt husband stated that pt really wanted to go home. Dr. Brien Few stated that he spoke with pt about signing out AMA.Will continue to monitor

## 2012-05-26 NOTE — Progress Notes (Signed)
Routine EEG completed.  

## 2012-05-26 NOTE — Care Management Note (Unsigned)
    Page 1 of 1   05/26/2012     1:17:22 PM   CARE MANAGEMENT NOTE 05/26/2012  Patient:  Lindsey Molina, Lindsey Molina   Account Number:  [id number]  Date Initiated:  05/26/2012  Documentation initiated by:  SIMMONS,Yania Bogie  Subjective/Objective Assessment:   ADMITTED WITH VASOVAGAL RESPONSE; LIVES AT HOME WITH HUSBAND; OFF UNIT CURRENTLY.     Action/Plan:   DISCHARGE PLANNING INITIATED.   Anticipated DC Date:  05/27/2012   Anticipated DC Plan:  HOME/SELF CARE      DC Planning Services  CM consult      Choice offered to / List presented to:             Status of service:  In process, will continue to follow Medicare Important Message given?   (If response is "NO", the following Medicare IM given date fields will be blank) Date Medicare IM given:   Date Additional Medicare IM given:    Discharge Disposition:    Per UR Regulation:  Reviewed for med. necessity/level of care/duration of stay  If discussed at Long Length of Stay Meetings, dates discussed:    Comments:  05/26/12  1316  Bassy Fetterly SIMMONS RN, BSN [phone] NCM WILL FOLLOW.

## 2012-05-27 DIAGNOSIS — E2740 Unspecified adrenocortical insufficiency: Secondary | ICD-10-CM | POA: Diagnosis present

## 2012-05-27 DIAGNOSIS — E2749 Other adrenocortical insufficiency: Secondary | ICD-10-CM

## 2012-05-27 LAB — COMPREHENSIVE METABOLIC PANEL
AST: 13 U/L (ref 0–37)
Albumin: 3.1 g/dL — ABNORMAL LOW (ref 3.5–5.2)
Calcium: 8.8 mg/dL (ref 8.4–10.5)
Creatinine, Ser: 0.44 mg/dL — ABNORMAL LOW (ref 0.50–1.10)

## 2012-05-27 LAB — CORTISOL: Cortisol, Plasma: 0.5 ug/dL

## 2012-05-27 MED ORDER — HYDROCORTISONE 20 MG PO TABS: 20.0000 mg | ORAL_TABLET | Freq: Every morning | ORAL | Status: AC

## 2012-05-27 MED ORDER — HYDROCORTISONE 10 MG PO TABS: 10.0000 mg | ORAL_TABLET | Freq: Every day | ORAL | Status: AC

## 2012-05-27 MED ORDER — BOOST / RESOURCE BREEZE PO LIQD: 1.0000 | Freq: Every day | ORAL | Status: DC | PRN

## 2012-05-27 MED ORDER — IBUPROFEN 400 MG PO TABS: 400.0000 mg | ORAL_TABLET | Freq: Three times a day (TID) | ORAL | Status: AC

## 2012-05-27 NOTE — Discharge Summary (Addendum)
Physician Discharge Summary  Lindsey Molina LKG:[id number] DOB: 12/12/1975 DOA: 05/25/2012  PCP: No primary provider on file.  Admit date: 05/25/2012 Discharge date: 05/27/2012  Recommendations for Outpatient Follow-up:  1. Follow up with Dr Talmage Coin, endocrinologist, on 06/07/12, at 8.45 AM.   Discharge Diagnoses:  Active Problems:  Syncope  Hypokalemia  Orthostasis  Abnormal finding on EKG  Hypoadrenalism   Discharge Condition: Satisfactory  Diet recommendation: Regular.  Filed Weights   05/25/12 2029  Weight: 74.934 kg (165 lb 3.2 oz)    History of present illness:  This is a 36 year old female, with no significant past medical history. Patient speaks only Arabic, so initial history was gleaned from ED MD, and subsequently obtained from patient and spouse, who speaks good [place]. It appears that patient was in her usual state of health until about 11:00 AM on 05/24/12, when the patient became dizzy while sitting down in a chair, and according to the husband, was unresponsive for possibly 10 minutes. She slid to the floor and had no involuntary movements. Patient denies antecedent chest pain , shortness of breath or palpitations. She has since complained of right arm pain. She went to see her primary MD today, and while in the Dr's office, at about 2:00 PM, received a shot of IM Toradol, 60 mg and had a syncopal event after. PMD gave patient 0.2 mg Epinephrine IM and Dexamethasone 2mg  IM, then referred her to to ED for evaluation. According to patient and spouse, she had a syncopal episode about 2 years ago, this time, while standing, and fell at that time. She recovered uneventfully after 10 minutes, but did not see a doctor.    Hospital Course:  1. Syncope: Patient suffered a syncopal episode on 05/25/12, which appeared to be clearly vaso-vagal. She admits to having been very anxious prior to receiving a Toradol injection at her PMD's office. Recovery was quick, and she appeared to  have suffered no deleterious effects. More concerning is her syncopal episode on 05/24/12, which occurred while patient was in a sitting position. She had no involuntary movements during episode and recovered spontaneously on recumbency, without obvious post-ictal symptoms. She has had no arrhythmias on telemetry, Head CT scan was negative for acute findings, EEG was a normal study, Carotid dopplers showed no hemodynamically significant stenoses, and 2D Echocardiogram showed normal left ventricular cavity size, cavity size, EF of 50% to 55% and no regional wall motion abnormalities. The right atrium was mildly dilated and there was no pericardial effusion.  2. Orthostasis: Patient experienced postural dizziness, although hydration status was satisfactory, iv fluids were administered, and she had no postural changes on orthostatic BP monitoring. Will discontinue iv fluids, today. Brain MRI was negative.   3. Abnormal finding on EKG: Patient's EKG shows SR, but concave upward ST elevation was also apparent in multiple leads, raising suspicion for pericardial effusion. 2D Echocardiogram was negative for pericardial effusion, cardiac enzymes were unelevated, and TSH was normal at 0.578 4. Hypoadrenalism/Hypokalemia: Patient presented with a hypokalemia of 3.4 She is not on any culprit medication. Potassium was repleted, random cortisol was low at 1.6. A Cortrosyn test was carried out, and showed a baseline cortisol of 0.5, followed by cortisol level of 14, 30 minutes after administration of 0.25 mg Cortrosyn. I discussed these findings with Dr Talmage Coin, endocrinologist, on 05/26/12, via telephone, and he has recommended Hydrocortisone replacement therapy,  As well as outpatient follow up in his office. ACTH has been ordered.  5. Right arm  pain: Musculoskeletal, after fall. Fortunately, no bony injuries. Managed with analgesics.    Procedures:  See below.   Consultations: Telephone discussion with Dr Sharl Ma,  endocrinologist, on 05/26/12.   Discharge Exam: Filed Vitals:   05/27/12 0525  BP: 104/77  Pulse: 70  Temp: 97.7 F (36.5 C)  Resp:    Filed Vitals:   05/26/12 1332 05/26/12 1335 05/26/12 2222 05/27/12 0525  BP: 112/77 108/76 113/79 104/77  Pulse: 83 84 78 70  Temp:   98.3 F (36.8 C) 97.7 F (36.5 C)  TempSrc:   Oral Oral  Resp:      Height:      Weight:      SpO2:   99% 97%    Physical Exam:  General: Alert, communicative, fully oriented, talking in complete sentences, not short of breath at rest.  HEENT: No clinical pallor, no jaundice, no conjunctival injection or discharge.  NECK: Supple, JVP not seen, no carotid bruits, no palpable lymphadenopathy, no palpable goiter.  CHEST: Clinically clear to auscultation, no wheezes, no crackles.  HEART: Sounds 1 and 2 heard, normal, regular, no murmurs.  ABDOMEN: Full, soft, non-tender, no palpable organomegaly, no palpable masses, normal bowel sounds.  GENITALIA: Not examined.  LOWER EXTREMITIES: No pitting edema, palpable peripheral pulses.  MUSCULOSKELETAL SYSTEM: Unremarkable.  CENTRAL NERVOUS SYSTEM: No focal neurologic deficit on gross examination.  Discharge Instructions   Medication List  As of 05/27/2012 11:13 AM   TAKE these medications         feeding supplement Liqd   Take 1 Container by mouth daily as needed (suboptimal intake).      hydrocortisone 20 MG tablet   Commonly known as: CORTEF   Take 1 tablet (20 mg total) by mouth every morning.      hydrocortisone 10 MG tablet   Commonly known as: CORTEF   Take 1 tablet (10 mg total) by mouth at bedtime.      ibuprofen 400 MG tablet   Commonly known as: ADVIL,MOTRIN   Take 1 tablet (400 mg total) by mouth 3 (three) times daily with meals.           Follow-up Information    Follow up with KERR,JEFFREY, MD on 06/07/2012. (At 8:45 AM.)    Contact information:   [address] [facility] [place]  [address] [phone]           The results of significant diagnostics from this hospitalization (including imaging, microbiology, ancillary and laboratory) are listed below for reference.    Significant Diagnostic Studies: Dg Shoulder Right  05/25/2012  *RADIOLOGY REPORT*  Clinical Data: History of trauma.  History of injury from fall with pain.  RIGHT SHOULDER - 2+ VIEW  Comparison: Right humerus examination.  Findings: Alignment is normal.  Joint spaces are preserved.  No fracture or dislocation is evident.  No soft tissue lesions are seen.  IMPRESSION: No fracture or dislocation is evident.   Original Report Authenticated By: Crawford Givens, M.D.    Ct Head Wo Contrast  05/25/2012  *RADIOLOGY REPORT*  Clinical Data: Syncopal episode.  Pain.  CT HEAD WITHOUT CONTRAST  Technique:  Contiguous axial images were obtained from the base of the skull through the vertex without contrast.  Comparison: None.  Findings: No acute intracranial abnormality is present. Specifically, there is no evidence for acute infarct, hemorrhage, mass, hydrocephalus, or extra-axial fluid collection.  The paranasal sinuses and mastoid air cells are clear.  The globes and orbits are  intact.  The osseous skull is intact.  IMPRESSION: Negative CT of the head.   Original Report Authenticated By: Jamesetta Orleans. MATTERN, M.D.    Dg Humerus Right  05/25/2012  *RADIOLOGY REPORT*  Clinical Data: History of trauma with injury from fall with pain.  RIGHT HUMERUS - 2+ VIEW  Comparison: Right shoulder examination.  Findings: Alignment is normal.  Joint spaces are preserved.  No fracture or dislocation is evident.  No soft tissue lesions are seen.  IMPRESSION: No fracture dislocation is evident.   Original Report Authenticated By: Crawford Givens, M.D.     Microbiology: No results found for this or any previous visit (from the past 240 hour(s)).   Labs: Basic Metabolic Panel:  Lab 05/27/12 1610 05/26/12 0700 05/25/12 2000 05/25/12 1550  NA  141 137 -- 137  K 3.4* 3.8 -- 3.4*  CL 107 106 -- 102  CO2 25 20 -- 21  GLUCOSE 90 119* -- 94  BUN 7 6 -- 8  CREATININE 0.44* 0.41* 0.38* 0.41*  CALCIUM 8.8 8.9 -- 9.2  MG -- -- -- --  PHOS -- -- -- --   Liver Function Tests:  Lab 05/27/12 0500 05/26/12 0700 05/25/12 1550  AST 13 15 19   ALT 13 15 18   ALKPHOS 58 67 77  BILITOT 0.4 0.3 0.3  PROT 6.8 7.2 8.0  ALBUMIN 3.1* 3.3* 3.7   No results found for this basename: LIPASE:5,AMYLASE:5 in the last 168 hours No results found for this basename: AMMONIA:5 in the last 168 hours CBC:  Lab 05/26/12 0700 05/25/12 2000 05/25/12 1550  WBC 7.5 6.8 5.7  NEUTROABS -- -- 4.4  HGB 11.9* 12.4 12.7  HCT 35.8* 37.5 37.4  MCV 78.5 78.1 78.4  PLT 186 150 171   Cardiac Enzymes: No results found for this basename: CKTOTAL:5,CKMB:5,CKMBINDEX:5,TROPONINI:5 in the last 168 hours BNP: BNP (last 3 results) No results found for this basename: PROBNP:3 in the last 8760 hours CBG: No results found for this basename: GLUCAP:5 in the last 168 hours  Time coordinating discharge: 40 minutes  Signed:  Mieka Leaton,CHRISTOPHER  [facility] 05/27/2012, 11:13 AM

## 2012-05-27 NOTE — Progress Notes (Signed)
D/c instructions reviewed with pt and husband by Dr Brien Few, as he signed the d/c instructions. Instructions reviewed again with husband and pt, offered interpreter, both denied need. Copy of instructions and scripts given to pt/husband. Pt eating lunch will call when ready for wheelchair to leave.   At 1240 the pt and husband decided to walk out, declined wheelchair. Steady gait.

## 2012-06-01 LAB — ACTH: C206 ACTH: 10 pg/mL (ref 10–46)

## 2012-08-30 ENCOUNTER — Ambulatory Visit: Payer: Self-pay | Admitting: Obstetrics and Gynecology

## 2014-05-03 ENCOUNTER — Encounter (HOSPITAL_COMMUNITY): Payer: Self-pay | Admitting: Emergency Medicine

## 2014-05-03 ENCOUNTER — Emergency Department (HOSPITAL_COMMUNITY): Payer: 59

## 2014-05-03 ENCOUNTER — Emergency Department (HOSPITAL_COMMUNITY)
Admission: EM | Admit: 2014-05-03 | Discharge: 2014-05-04 | Disposition: A | Discharge status: Home / Self Care | Payer: 59 | Attending: Emergency Medicine | Admitting: Emergency Medicine

## 2014-05-03 DIAGNOSIS — IMO0002 Reserved for concepts with insufficient information to code with codable children: Secondary | ICD-10-CM | POA: Diagnosis not present

## 2014-05-03 DIAGNOSIS — M79609 Pain in unspecified limb: Secondary | ICD-10-CM | POA: Diagnosis present

## 2014-05-03 DIAGNOSIS — Z8679 Personal history of other diseases of the circulatory system: Secondary | ICD-10-CM | POA: Insufficient documentation

## 2014-05-03 DIAGNOSIS — M5430 Sciatica, unspecified side: Secondary | ICD-10-CM | POA: Diagnosis not present

## 2014-05-03 DIAGNOSIS — M5431 Sciatica, right side: Secondary | ICD-10-CM

## 2014-05-03 MED ORDER — DEXAMETHASONE SODIUM PHOSPHATE 10 MG/ML IJ SOLN
10.0000 mg | Freq: Once | INTRAMUSCULAR | Status: AC
  Administered 2014-05-03: 10 mg via INTRAMUSCULAR
  Filled 2014-05-03: qty 1

## 2014-05-03 MED ORDER — KETOROLAC TROMETHAMINE 60 MG/2ML IM SOLN
60.0000 mg | Freq: Once | INTRAMUSCULAR | Status: AC
  Administered 2014-05-03: 60 mg via INTRAMUSCULAR
  Filled 2014-05-03: qty 2

## 2014-05-03 NOTE — ED Notes (Signed)
Pt states she has been having R leg pain x 2 days, speaks minimal english, unable to obtain any other information

## 2014-05-03 NOTE — ED Provider Notes (Signed)
CSN: [id number]     Arrival date & time 05/03/14  2058 History  This chart was scribed for non-physician practitioner, Arthor Captain, PA-C,working with Toy Cookey, MD by Karle Plumber, ED Scribe. This patient was seen in room WTR8/WTR8 and the patient's care was started at 11:00 PM.  Chief Complaint  Patient presents with  . Leg Pain    right   Patient is a 38 y.o. female presenting with leg pain. The history is provided by the patient. A language interpreter was used.  Leg Pain  HPI Comments:  Arnelle Nale is a 38 y.o. female who presents to the Emergency Department complaining of chronic worsening severe, sharp, constant right leg pain onset five days ago. She states the pain began suddenly upon waking. She reports the pain starts in the lower right-sided back and wraps around to the front of the leg. Pt reports the pain is preventing her from sleeping. She reports there are no alleviating or worsening factors of the pain. She states the leg has been hot, but now it is cold. Pt reports that she went to an urgent care and was prescribed Ibuprofen and Hydrocortisone daily with the last dose being six hours ago. She states she has had this pain before for approximately six months but it resolved on its own. She denies any numbness or tingling of the right foot or bowel or bladder incontinence. She denies any trauma, injury or fall. Pt is ambulatory with a cane.   Past Medical History  Diagnosis Date  . Syncope and collapse 2011; 05/24/2012  . Hypotension 05/25/2012  . Dizziness 05/25/2012    "severe; and I pass out"  . Shortness of breath 05/25/2012    "occurs when I pass out"  . Respiratory arrest 05/25/2012    "occurs when I black out; I can't breath"  . Numbness and tingling 05/25/2012    "and pain right arm when I get mad,  nervous and/or blackout; sometimes; not always"   Past Surgical History  Procedure Laterality Date  . Cesarean section  2005; 2011   No family history on  file. History  Substance Use Topics  . Smoking status: Never Smoker   . Smokeless tobacco: Never Used  . Alcohol Use: No   OB History   Grav Para Term Preterm Abortions TAB SAB Ect Mult Living                 Review of Systems  Musculoskeletal: Positive for arthralgias.  Neurological: Negative for numbness.  All other systems reviewed and are negative.   Allergies  Review of patient's allergies indicates no known allergies.  Home Medications   Prior to Admission medications   Medication Sig Start Date End Date Taking? Authorizing Provider  hydrocortisone (CORTEF) 10 MG tablet Take 10 mg by mouth 2 (two) times daily. Take 1 tablet at 0600, take 1/2 tablet (5mg ) at 1400   Yes Historical Provider, MD   Triage Vitals: BP 116/70  Pulse 82  Temp(Src) 99.5 F (37.5 C) (Oral)  Resp 16  SpO2 100% Physical Exam  Nursing note and vitals reviewed. Constitutional: She is oriented to person, place, and time. She appears well-developed and well-nourished.  HENT:  Head: Normocephalic and atraumatic.  Eyes: EOM are normal.  Neck: Normal range of motion.  Cardiovascular: Normal rate.   Pulmonary/Chest: Effort normal.  Musculoskeletal: Normal range of motion.  Neurological: She is alert and oriented to person, place, and time. She has normal reflexes.  Skin: Skin is warm  and dry.  Psychiatric: She has a normal mood and affect. Her behavior is normal.    ED Course  Procedures (including critical care time) DIAGNOSTIC STUDIES: Oxygen Saturation is 100% on RA, normal by my interpretation.   COORDINATION OF CARE: 11:17 PM- Will order pain medication. Pt verbalizes understanding and agrees to plan.  Medications  dexamethasone (DECADRON) injection 10 mg (not administered)  ketorolac (TORADOL) injection 60 mg (60 mg Intramuscular Given 05/03/14 2321)    Labs Review Labs Reviewed - No data to display  Imaging Review No results found.   EKG Interpretation None      MDM    Final diagnoses:  Sciatica, right    Patient with apparent sciactica. Imaging is negative for acute abnormality. Seen in shared visit with Dr. Micheline Mazeocherty.  No available translator for the patient's specific dialect of arabic. Son does speak English however history took close to 45 minutes and was extremely difficult,  Patient with back pain.  No neurological deficits and normal neuro exam.  Patient can walk but states is painful.  No loss of bowel or bladder control.  No concern for cauda equina.  No fever, night sweats, weight loss, h/o cancer, IVDU.  RICE protocol and pain medicine indicated and discussed with patient.    I performed the services described in this documentation, which was scribed in my presence. personally The recorded information has been reviewed and is accurate.    Arthor Captainbigail Luwanda Starr, PA-C 05/09/14 1933

## 2014-05-04 MED ORDER — PREDNISONE 20 MG PO TABS: 40.0000 mg | ORAL_TABLET | Freq: Every day | ORAL | Status: DC

## 2014-05-04 MED ORDER — OXYCODONE-ACETAMINOPHEN 5-325 MG PO TABS: 1.0000 | ORAL_TABLET | ORAL | Status: DC | PRN

## 2014-05-04 NOTE — Discharge Instructions (Signed)
SEEK IMMEDIATE MEDICAL ATTENTION IF: °New numbness, tingling, weakness, or problem with the use of your arms or legs.  °Severe back pain not relieved with medications.  °Change in bowel or bladder control.  °Increasing pain in any areas of the body (such as chest or abdominal pain).  °Shortness of breath, dizziness or fainting.  °Nausea (feeling sick to your stomach), vomiting, fever, or sweats. ° ° °Sciatica °Sciatica is pain, weakness, numbness, or tingling along the path of the sciatic nerve. The nerve starts in the lower back and runs down the back of each leg. The nerve controls the muscles in the lower leg and in the back of the knee, while also providing sensation to the back of the thigh, lower leg, and the sole of your foot. Sciatica is a symptom of another medical condition. For instance, nerve damage or certain conditions, such as a herniated disk or bone spur on the spine, pinch or put pressure on the sciatic nerve. This causes the pain, weakness, or other sensations normally associated with sciatica. Generally, sciatica only affects one side of the body. °CAUSES  °· Herniated or slipped disc. °· Degenerative disk disease. °· A pain disorder involving the narrow muscle in the buttocks (piriformis syndrome). °· Pelvic injury or fracture. °· Pregnancy. °· Tumor (rare). °SYMPTOMS  °Symptoms can vary from mild to very severe. The symptoms usually travel from the low back to the buttocks and down the back of the leg. Symptoms can include: °· Mild tingling or dull aches in the lower back, leg, or hip. °· Numbness in the back of the calf or sole of the foot. °· Burning sensations in the lower back, leg, or hip. °· Sharp pains in the lower back, leg, or hip. °· Leg weakness. °· Severe back pain inhibiting movement. °These symptoms may get worse with coughing, sneezing, laughing, or prolonged sitting or standing. Also, being overweight may worsen symptoms. °DIAGNOSIS  °Your caregiver will perform a physical exam  to look for common symptoms of sciatica. He or she may ask you to do certain movements or activities that would trigger sciatic nerve pain. Other tests may be performed to find the cause of the sciatica. These may include: °· Blood tests. °· X-rays. °· Imaging tests, such as an MRI or CT scan. °TREATMENT  °Treatment is directed at the cause of the sciatic pain. Sometimes, treatment is not necessary and the pain and discomfort goes away on its own. If treatment is needed, your caregiver may suggest: °· Over-the-counter medicines to relieve pain. °· Prescription medicines, such as anti-inflammatory medicine, muscle relaxants, or narcotics. °· Applying heat or ice to the painful area. °· Steroid injections to lessen pain, irritation, and inflammation around the nerve. °· Reducing activity during periods of pain. °· Exercising and stretching to strengthen your abdomen and improve flexibility of your spine. Your caregiver may suggest losing weight if the extra weight makes the back pain worse. °· Physical therapy. °· Surgery to eliminate what is pressing or pinching the nerve, such as a bone spur or part of a herniated disk. °HOME CARE INSTRUCTIONS  °· Only take over-the-counter or prescription medicines for pain or discomfort as directed by your caregiver. °· Apply ice to the affected area for 20 minutes, 3-4 times a day for the first 48-72 hours. Then try heat in the same way. °· Exercise, stretch, or perform your usual activities if these do not aggravate your pain. °· Attend physical therapy sessions as directed by your caregiver. °· Keep   all follow-up appointments as directed by your caregiver. °· Do not wear high heels or shoes that do not provide proper support. °· Check your mattress to see if it is too soft. A firm mattress may lessen your pain and discomfort. °SEEK IMMEDIATE MEDICAL CARE IF:  °· You lose control of your bowel or bladder (incontinence). °· You have increasing weakness in the lower back, pelvis,  buttocks, or legs. °· You have redness or swelling of your back. °· You have a burning sensation when you urinate. °· You have pain that gets worse when you lie down or awakens you at night. °· Your pain is worse than you have experienced in the past. °· Your pain is lasting longer than 4 weeks. °· You are suddenly losing weight without reason. °MAKE SURE YOU: °· Understand these instructions. °· Will watch your condition. °· Will get help right away if you are not doing well or get worse. °Document Released: 09/08/2001 Document Revised: 03/15/2012 Document Reviewed: 01/24/2012 °[employer]® Patient Information ©2015 [employer]. This information is not intended to replace advice given to you by your health care provider. Make sure you discuss any questions you have with your health care provider. ° °

## 2014-05-08 ENCOUNTER — Telehealth (HOSPITAL_BASED_OUTPATIENT_CLINIC_OR_DEPARTMENT_OTHER): Payer: Self-pay | Admitting: Emergency Medicine

## 2014-05-09 NOTE — ED Provider Notes (Signed)
Medical screening examination/treatment/procedure(s) were conducted as a shared visit with non-physician practitioner(s) and myself.  I personally evaluated the patient during the encounter. Pt presents with R hip pain w/ radiation down posterior leg. No midline back pain, no numbness, weakness, fever, bowel or bladder incontinence.  Suspect sciatica. Will d/c home w/ rec for supportive care.     EKG Interpretation None        Toy CookeyMegan Docherty, MD 05/10/14 0002

## 2015-07-04 ENCOUNTER — Emergency Department (HOSPITAL_COMMUNITY): Payer: Medicaid Other

## 2015-07-04 ENCOUNTER — Encounter (HOSPITAL_COMMUNITY): Payer: Self-pay | Admitting: *Deleted

## 2015-07-04 ENCOUNTER — Emergency Department (HOSPITAL_COMMUNITY)
Admission: EM | Admit: 2015-07-04 | Discharge: 2015-07-04 | Disposition: A | Discharge status: Home / Self Care | Payer: Medicaid Other | Attending: Emergency Medicine | Admitting: Emergency Medicine

## 2015-07-04 DIAGNOSIS — O26899 Other specified pregnancy related conditions, unspecified trimester: Secondary | ICD-10-CM

## 2015-07-04 DIAGNOSIS — O26851 Spotting complicating pregnancy, first trimester: Secondary | ICD-10-CM

## 2015-07-04 DIAGNOSIS — N83201 Unspecified ovarian cyst, right side: Secondary | ICD-10-CM | POA: Diagnosis not present

## 2015-07-04 DIAGNOSIS — Z3A01 Less than 8 weeks gestation of pregnancy: Secondary | ICD-10-CM | POA: Diagnosis not present

## 2015-07-04 DIAGNOSIS — O9989 Other specified diseases and conditions complicating pregnancy, childbirth and the puerperium: Secondary | ICD-10-CM | POA: Diagnosis not present

## 2015-07-04 DIAGNOSIS — R52 Pain, unspecified: Secondary | ICD-10-CM

## 2015-07-04 DIAGNOSIS — O26859 Spotting complicating pregnancy, unspecified trimester: Secondary | ICD-10-CM

## 2015-07-04 DIAGNOSIS — O99111 Other diseases of the blood and blood-forming organs and certain disorders involving the immune mechanism complicating pregnancy, first trimester: Secondary | ICD-10-CM | POA: Diagnosis not present

## 2015-07-04 DIAGNOSIS — O210 Mild hyperemesis gravidarum: Secondary | ICD-10-CM | POA: Diagnosis not present

## 2015-07-04 DIAGNOSIS — O3481 Maternal care for other abnormalities of pelvic organs, first trimester: Secondary | ICD-10-CM | POA: Insufficient documentation

## 2015-07-04 DIAGNOSIS — D500 Iron deficiency anemia secondary to blood loss (chronic): Secondary | ICD-10-CM

## 2015-07-04 DIAGNOSIS — Z8679 Personal history of other diseases of the circulatory system: Secondary | ICD-10-CM | POA: Diagnosis not present

## 2015-07-04 DIAGNOSIS — R102 Pelvic and perineal pain: Secondary | ICD-10-CM

## 2015-07-04 DIAGNOSIS — O09511 Supervision of elderly primigravida, first trimester: Secondary | ICD-10-CM | POA: Diagnosis not present

## 2015-07-04 DIAGNOSIS — O99011 Anemia complicating pregnancy, first trimester: Secondary | ICD-10-CM

## 2015-07-04 LAB — I-STAT CHEM 8, ED
BUN: 6 mg/dL (ref 6–20)
CALCIUM ION: 1.02 mmol/L — AB (ref 1.12–1.23)
CHLORIDE: 105 mmol/L (ref 101–111)
Creatinine, Ser: 0.4 mg/dL — ABNORMAL LOW (ref 0.44–1.00)
Glucose, Bld: 112 mg/dL — ABNORMAL HIGH (ref 65–99)
HEMATOCRIT: 64 % — AB (ref 36.0–46.0)
Hemoglobin: 21.8 g/dL (ref 12.0–15.0)
POTASSIUM: 4.3 mmol/L (ref 3.5–5.1)
SODIUM: 137 mmol/L (ref 135–145)
TCO2: 19 mmol/L (ref 0–100)

## 2015-07-04 LAB — CBC WITH DIFFERENTIAL/PLATELET
BASOS PCT: 0 %
Basophils Absolute: 0 10*3/uL (ref 0.0–0.1)
EOS ABS: 0 10*3/uL (ref 0.0–0.7)
Eosinophils Relative: 0 %
HCT: 17 % — ABNORMAL LOW (ref 36.0–46.0)
HEMOGLOBIN: 5.5 g/dL — AB (ref 12.0–15.0)
Lymphocytes Relative: 39 %
Lymphs Abs: 2.8 10*3/uL (ref 0.7–4.0)
MCH: 24.3 pg — ABNORMAL LOW (ref 26.0–34.0)
MCHC: 32.4 g/dL (ref 30.0–36.0)
MCV: 75.2 fL — ABNORMAL LOW (ref 78.0–100.0)
Monocytes Absolute: 0.7 10*3/uL (ref 0.1–1.0)
Monocytes Relative: 10 %
NEUTROS PCT: 50 %
Neutro Abs: 3.6 10*3/uL (ref 1.7–7.7)
Platelets: 190 10*3/uL (ref 150–400)
RBC: 2.26 MIL/uL — AB (ref 3.87–5.11)
RDW: 15.1 % (ref 11.5–15.5)
WBC: 7.2 10*3/uL (ref 4.0–10.5)

## 2015-07-04 LAB — COMPREHENSIVE METABOLIC PANEL
ALT: 12 U/L — ABNORMAL LOW (ref 14–54)
ANION GAP: 9 (ref 5–15)
AST: 20 U/L (ref 15–41)
Albumin: 4 g/dL (ref 3.5–5.0)
Alkaline Phosphatase: 52 U/L (ref 38–126)
BUN: 6 mg/dL (ref 6–20)
CHLORIDE: 107 mmol/L (ref 101–111)
CO2: 20 mmol/L — AB (ref 22–32)
Calcium: 8.7 mg/dL — ABNORMAL LOW (ref 8.9–10.3)
Creatinine, Ser: 0.47 mg/dL (ref 0.44–1.00)
Glucose, Bld: 102 mg/dL — ABNORMAL HIGH (ref 65–99)
POTASSIUM: 3.6 mmol/L (ref 3.5–5.1)
SODIUM: 136 mmol/L (ref 135–145)
Total Bilirubin: 0.8 mg/dL (ref 0.3–1.2)
Total Protein: 7.7 g/dL (ref 6.5–8.1)

## 2015-07-04 LAB — IRON AND TIBC
IRON: 35 ug/dL (ref 28–170)
Saturation Ratios: 7 % — ABNORMAL LOW (ref 10.4–31.8)
TIBC: 480 ug/dL — ABNORMAL HIGH (ref 250–450)
UIBC: 445 ug/dL

## 2015-07-04 LAB — URINALYSIS, ROUTINE W REFLEX MICROSCOPIC
BILIRUBIN URINE: NEGATIVE
GLUCOSE, UA: NEGATIVE mg/dL
KETONES UR: NEGATIVE mg/dL
NITRITE: NEGATIVE
PROTEIN: NEGATIVE mg/dL
Specific Gravity, Urine: 1.02 (ref 1.005–1.030)
Urobilinogen, UA: 0.2 mg/dL (ref 0.0–1.0)
pH: 5.5 (ref 5.0–8.0)

## 2015-07-04 LAB — URINE MICROSCOPIC-ADD ON

## 2015-07-04 LAB — RETICULOCYTES
RBC.: 2.21 MIL/uL — AB (ref 3.87–5.11)
RETIC COUNT ABSOLUTE: 28.7 10*3/uL (ref 19.0–186.0)
Retic Ct Pct: 1.3 % (ref 0.4–3.1)

## 2015-07-04 LAB — AMYLASE: Amylase: 136 U/L — ABNORMAL HIGH (ref 28–100)

## 2015-07-04 LAB — OB RESULTS CONSOLE ABO/RH: RH TYPE: POSITIVE

## 2015-07-04 LAB — POC OCCULT BLOOD, ED: FECAL OCCULT BLD: NEGATIVE

## 2015-07-04 LAB — I-STAT BETA HCG BLOOD, ED (MC, WL, AP ONLY)

## 2015-07-04 LAB — ABO/RH: ABO/RH(D): O POS

## 2015-07-04 LAB — LIPASE, BLOOD: LIPASE: 21 U/L — AB (ref 22–51)

## 2015-07-04 LAB — FOLATE: Folate: 21.9 ng/mL (ref >5.9)

## 2015-07-04 LAB — VITAMIN B12: Vitamin B-12: 250 pg/mL (ref 180–914)

## 2015-07-04 LAB — HCG, QUANTITATIVE, PREGNANCY: hCG, Beta Chain, Quant, S: 67903 m[IU]/mL — ABNORMAL HIGH (ref <5)

## 2015-07-04 LAB — PREPARE RBC (CROSSMATCH)

## 2015-07-04 MED ORDER — ONDANSETRON HCL 4 MG/2ML IJ SOLN
4.0000 mg | Freq: Once | INTRAMUSCULAR | Status: AC
  Administered 2015-07-04: 4 mg via INTRAVENOUS
  Filled 2015-07-04: qty 2

## 2015-07-04 MED ORDER — MORPHINE SULFATE (PF) 4 MG/ML IV SOLN
4.0000 mg | Freq: Once | INTRAVENOUS | Status: AC
  Administered 2015-07-04: 4 mg via INTRAVENOUS
  Filled 2015-07-04: qty 1

## 2015-07-04 MED ORDER — FOLIC ACID 1 MG PO TABS: 1.0000 mg | ORAL_TABLET | Freq: Every day | ORAL | Status: DC

## 2015-07-04 MED ORDER — SODIUM CHLORIDE 0.9 % IV SOLN
10.0000 mL/h | Freq: Once | INTRAVENOUS | Status: AC
  Administered 2015-07-04: 10 mL/h via INTRAVENOUS

## 2015-07-04 NOTE — ED Notes (Signed)
Pt drinking water 

## 2015-07-04 NOTE — ED Notes (Signed)
Pt getting ultrasound.

## 2015-07-04 NOTE — ED Provider Notes (Signed)
CSN: [id number]     Arrival date & time 07/04/15  0115 History  By signing my name below, I, Emmanuella Mensah, attest that this documentation has been prepared under the direction and in the presence of Derwood Kaplan, MD. Electronically Signed: Angelene Giovanni, ED Scribe. 07/04/2015. 3:09 AM.   Chief Complaint  Patient presents with  . Abdominal Pain   The history is provided by the patient. A language interpreter was used.   HPI Comments: Lindsey Molina is a 39 y.o. female who presents to the Emergency Department complaining of constant, gradually worsening non-radiating sharp umbilical abdominal pain onset 7 pm last night. She reports associated 5 episodes of vomiting of white, watery fluid and her last episode was outside [facility]. Her last BM was this am. She denies any diarrhea, or being gassy. She denies that anything specific makes it worse such as position or food but adds that when she drinks water, she immediately vomits. She denies any other similar symptoms in the past. She reports that the only abdominal surgeries she had was a C-section. She states that she has not had her menstrual period for 2 months but is unsure if she is currently pregnant. She reports NKDA.   Pt also complains of gradually worsening left breast pain onset 3 days ago. She denies any redness or swelling. No gynecologist.   Past Medical History  Diagnosis Date  . Syncope and collapse 2011; 05/24/2012  . Hypotension 05/25/2012  . Dizziness 05/25/2012    "severe; and I pass out"  . Shortness of breath 05/25/2012    "occurs when I pass out"  . Respiratory arrest (HCC) 05/25/2012    "occurs when I black out; I can't breath"  . Numbness and tingling 05/25/2012    "and pain right arm when I get mad,  nervous and/or blackout; sometimes; not always"   Past Surgical History  Procedure Laterality Date  . Cesarean section  2005; 2011   No family history on file. Social History  Substance Use Topics  . Smoking  status: Never Smoker   . Smokeless tobacco: Never Used  . Alcohol Use: No   OB History    No data available     Review of Systems  Gastrointestinal: Positive for nausea, vomiting and abdominal pain. Negative for diarrhea.  Musculoskeletal: Positive for myalgias (Left breast).    ROS 10 Systems reviewed and are negative for acute change except as noted in the HPI.      Allergies  Review of patient's allergies indicates no known allergies.  Home Medications   Prior to Admission medications   Medication Sig Start Date End Date Taking? Authorizing Provider  folic acid (FOLVITE) 1 MG tablet Take 1 tablet (1 mg total) by mouth daily. 07/04/15   Derwood Kaplan, MD  oxyCODONE-acetaminophen (PERCOCET) 5-325 MG per tablet Take 1-2 tablets by mouth every 4 (four) hours as needed. Patient not taking: Reported on 07/04/2015 05/04/14   Arthor Captain, PA-C  predniSONE (DELTASONE) 20 MG tablet Take 2 tablets (40 mg total) by mouth daily. Patient not taking: Reported on 07/04/2015 05/04/14   Arthor Captain, PA-C   BP 106/64 mmHg  Pulse 71  Temp(Src) 98.3 F (36.8 C) (Oral)  Resp 18  SpO2 100%  LMP 05/22/2012 Physical Exam  Constitutional: She is oriented to person, place, and time. She appears well-developed and well-nourished.  HENT:  Head: Normocephalic and atraumatic.  Mouth/Throat: Oropharynx is clear and moist.  Cardiovascular: Normal rate and regular rhythm.   Pulmonary/Chest: Effort normal  and breath sounds normal. She has no wheezes. She has no rales.  Abdominal: She exhibits no distension. There is tenderness. There is no rebound and no guarding.  Hyperactive bowel sounds.  Epigastric and RUQ tenderness with no rebound or guarding  Musculoskeletal:  Cap refills less than 2 sec  Neurological: She is alert and oriented to person, place, and time.  Skin: Skin is warm and dry.  Psychiatric: She has a normal mood and affect.  Nursing note and vitals reviewed.   ED Course   Procedures (including critical care time) DIAGNOSTIC STUDIES: Oxygen Saturation is 100% on RA, normal by my interpretation.    COORDINATION OF CARE: 3:08 AM- Pt advised of plan for treatment and pt agrees.   Labs Review Labs Reviewed  AMYLASE - Abnormal; Notable for the following:    Amylase 136 (*)    All other components within normal limits  CBC WITH DIFFERENTIAL/PLATELET - Abnormal; Notable for the following:    RBC 2.26 (*)    Hemoglobin 5.5 (*)    HCT 17.0 (*)    MCV 75.2 (*)    MCH 24.3 (*)    All other components within normal limits  LIPASE, BLOOD - Abnormal; Notable for the following:    Lipase 21 (*)    All other components within normal limits  URINALYSIS, ROUTINE W REFLEX MICROSCOPIC (NOT AT [facility]) - Abnormal; Notable for the following:    Color, Urine AMBER (*)    APPearance CLOUDY (*)    Hgb urine dipstick SMALL (*)    Leukocytes, UA TRACE (*)    All other components within normal limits  COMPREHENSIVE METABOLIC PANEL - Abnormal; Notable for the following:    CO2 20 (*)    Glucose, Bld 102 (*)    Calcium 8.7 (*)    ALT 12 (*)    All other components within normal limits  URINE MICROSCOPIC-ADD ON - Abnormal; Notable for the following:    Squamous Epithelial / LPF MANY (*)    Bacteria, UA FEW (*)    All other components within normal limits  HCG, QUANTITATIVE, PREGNANCY - Abnormal; Notable for the following:    hCG, Beta Chain, Quant, [place] [address] (*)    All other components within normal limits  RETICULOCYTES - Abnormal; Notable for the following:    RBC. 2.21 (*)    All other components within normal limits  I-STAT CHEM 8, ED - Abnormal; Notable for the following:    Creatinine, Ser 0.40 (*)    Glucose, Bld 112 (*)    Calcium, Ion 1.02 (*)    Hemoglobin 21.8 (*)    HCT 64.0 (*)    All other components within normal limits  I-STAT BETA HCG BLOOD, ED ([facility], [facility], [facility] ONLY) - Abnormal; Notable for the following:    I-stat hCG, quantitative >2000.0 (*)    All  other components within normal limits  VITAMIN B12  FOLATE  IRON AND TIBC  TYPE AND SCREEN  PREPARE RBC (CROSSMATCH)  ABO/RH    Imaging Review US Ob Comp Less 14 Wks  07/04/2015   CLINICAL DATA:  Acute onset of generalized abdominal pain and vaginal spotting. Initial encounter.  EXAM: OBSTETRIC <14 WK [place] AND TRANSVAGINAL OB US  TECHNIQUE: Both transabdominal and transvaginal ultrasound examinations were performed for complete evaluation of the gestation as well as the maternal uterus, adnexal regions, and pelvic cul-de-sac. Transvaginal technique was performed to assess early pregnancy.  COMPARISON:  CT of the abdomen and pelvis performed  05/22/2011  FINDINGS: Intrauterine gestational sac: Visualized/normal in shape.  Yolk sac:  Yes  Embryo:  Yes  Cardiac Activity: Yes  Heart Rate: 107  bpm  CRL:  0.54 cm   6 w   2 d                  [place] EDC: 02/25/2016  Maternal uterus/adnexae: A small amount of subchorionic hemorrhage is noted. The uterus is otherwise unremarkable.  A large right ovarian cyst is noted, measuring 7.5 x 7.4 x 6.7 cm. This displaces surrounding structures. The right ovary measures 11.1 x 7.2 x 7.8 cm. The left ovary is unremarkable in appearance, measuring 3.0 x 2.2 x 2.1 cm. There is no evidence for ovarian torsion. No suspicious adnexal masses are seen.  Trace free fluid is seen within the pelvic cul-de-sac.  IMPRESSION: 1. Single live intrauterine pregnancy noted, with a crown-rump length of 5 mm, corresponding to a gestational age of [redacted] weeks 2 days. This reflects an estimated date of delivery of Feb 25, 2016. 2. Small amount of subchorionic hemorrhage noted. 3. Large right ovarian cyst, measuring 7.5 cm in size.   Electronically Signed   By: Roanna Raider M.D.   On: 07/04/2015 05:37   US Ob Transvaginal  07/04/2015   CLINICAL DATA:  Acute onset of generalized abdominal pain and vaginal spotting. Initial encounter.  EXAM: OBSTETRIC <14 WK [place] AND TRANSVAGINAL OB US  TECHNIQUE: Both  transabdominal and transvaginal ultrasound examinations were performed for complete evaluation of the gestation as well as the maternal uterus, adnexal regions, and pelvic cul-de-sac. Transvaginal technique was performed to assess early pregnancy.  COMPARISON:  CT of the abdomen and pelvis performed 05/22/2011  FINDINGS: Intrauterine gestational sac: Visualized/normal in shape.  Yolk sac:  Yes  Embryo:  Yes  Cardiac Activity: Yes  Heart Rate: 107  bpm  CRL:  0.54 cm   6 w   2 d                  [place] EDC: 02/25/2016  Maternal uterus/adnexae: A small amount of subchorionic hemorrhage is noted. The uterus is otherwise unremarkable.  A large right ovarian cyst is noted, measuring 7.5 x 7.4 x 6.7 cm. This displaces surrounding structures. The right ovary measures 11.1 x 7.2 x 7.8 cm. The left ovary is unremarkable in appearance, measuring 3.0 x 2.2 x 2.1 cm. There is no evidence for ovarian torsion. No suspicious adnexal masses are seen.  Trace free fluid is seen within the pelvic cul-de-sac.  IMPRESSION: 1. Single live intrauterine pregnancy noted, with a crown-rump length of 5 mm, corresponding to a gestational age of [redacted] weeks 2 days. This reflects an estimated date of delivery of Feb 25, 2016. 2. Small amount of subchorionic hemorrhage noted. 3. Large right ovarian cyst, measuring 7.5 cm in size.   Electronically Signed   By: Roanna Raider M.D.   On: 07/04/2015 05:37      EKG Interpretation None      MDM   Final diagnoses:  Pain  Advanced maternal age, 1st pregnancy, first trimester  Iron deficiency anemia due to chronic blood loss  Cyst of right ovary  Spotting in pregnancy    I personally performed the services described in this documentation, which was scribed in my presence. The recorded information has been reviewed and is accurate.   Pt comes in with cc of abd pain. Sudden onset, she has associated nausea and emesis. Initial concern was cholelithiasis and pancreatitis.  Hcg came +. Pt is  noted to be anemic. She reports no active blood loss. She does endorse dizziness and fatigue. We will transfuse her. She has some spotting.  [place] ordered - ectopic ruled out. She has a large ovarian cyst. She had color dopplers done, but no wave form - so we will get wave form study to ensure there is no torsion. Pretest probability is low.  Spoke with Dr. Despina Hidden, he would like to have pt f/u in 1 week.  [place] abd ordered to ensure there is no cholelithiasis. Pt started on oral challenge, and has passed.  CRITICAL CARE Performed by: Derwood Kaplan   Total critical care time: 70 min  Critical care time was exclusive of separately billable procedures and treating other patients.  Critical care was necessary to treat or prevent imminent or life-threatening deterioration.  Critical care was time spent personally by me on the following activities: development of treatment plan with patient and/or surrogate as well as nursing, discussions with consultants, evaluation of patient's response to treatment, examination of patient, obtaining history from patient or surrogate, ordering and performing treatments and interventions, ordering and review of laboratory studies, ordering and review of radiographic studies, pulse oximetry and re-evaluation of patient's condition.    Derwood Kaplan, MD 07/04/15 [phone]

## 2015-07-04 NOTE — ED Notes (Signed)
Pt states that she began having abd pain around 7pm; pt reports vomiting; pt rocking in the wheelchair in triage unable to sit still; pt is holding mid abd area

## 2015-07-04 NOTE — ED Provider Notes (Signed)
9:02 AM Care transferred to me. Ultrasound shows no evidence of torsion. Patient's abdominal pain is much better. Blood transfusion is complete. Discussed return precautions, new meds and importance of follow up with patient and family through [facility].    Results for orders placed or performed during the hospital encounter of 07/04/15  Amylase  Result Value Ref Range   Amylase 136 (H) 28 - 100 U/L  CBC WITH DIFFERENTIAL  Result Value Ref Range   WBC 7.2 4.0 - 10.5 K/uL   RBC 2.26 (L) 3.87 - 5.11 MIL/uL   Hemoglobin 5.5 (LL) 12.0 - 15.0 g/dL   HCT 16.1 (L) 09.6 - 04.5 %   MCV 75.2 (L) 78.0 - 100.0 fL   MCH 24.3 (L) 26.0 - 34.0 pg   MCHC 32.4 30.0 - 36.0 g/dL   RDW 40.9 81.1 - 91.4 %   Platelets 190 150 - 400 K/uL   Neutrophils Relative % 50 %   Neutro Abs 3.6 1.7 - 7.7 K/uL   Lymphocytes Relative 39 %   Lymphs Abs 2.8 0.7 - 4.0 K/uL   Monocytes Relative 10 %   Monocytes Absolute 0.7 0.1 - 1.0 K/uL   Eosinophils Relative 0 %   Eosinophils Absolute 0.0 0.0 - 0.7 K/uL   Basophils Relative 0 %   Basophils Absolute 0.0 0.0 - 0.1 K/uL  Lipase, blood  Result Value Ref Range   Lipase 21 (L) 22 - 51 U/L  Urinalysis, Routine w reflex microscopic (not at [facility])  Result Value Ref Range   Color, Urine AMBER (A) YELLOW   APPearance CLOUDY (A) CLEAR   Specific Gravity, Urine 1.020 1.005 - 1.030   pH 5.5 5.0 - 8.0   Glucose, UA NEGATIVE NEGATIVE mg/dL   Hgb urine dipstick SMALL (A) NEGATIVE   Bilirubin Urine NEGATIVE NEGATIVE   Ketones, ur NEGATIVE NEGATIVE mg/dL   Protein, ur NEGATIVE NEGATIVE mg/dL   Urobilinogen, UA 0.2 0.0 - 1.0 mg/dL   Nitrite NEGATIVE NEGATIVE   Leukocytes, UA TRACE (A) NEGATIVE  Comprehensive metabolic panel  Result Value Ref Range   Sodium 136 135 - 145 mmol/L   Potassium 3.6 3.5 - 5.1 mmol/L   Chloride 107 101 - 111 mmol/L   CO2 20 (L) 22 - 32 mmol/L   Glucose, Bld 102 (H) 65 - 99 mg/dL   BUN 6 6 - 20 mg/dL   Creatinine, Ser 7.82 0.44 - 1.00  mg/dL   Calcium 8.7 (L) 8.9 - 10.3 mg/dL   Total Protein 7.7 6.5 - 8.1 g/dL   Albumin 4.0 3.5 - 5.0 g/dL   AST 20 15 - 41 U/L   ALT 12 (L) 14 - 54 U/L   Alkaline Phosphatase 52 38 - 126 U/L   Total Bilirubin 0.8 0.3 - 1.2 mg/dL   GFR calc non Af Amer >60 >60 mL/min   GFR calc Af Amer >60 >60 mL/min   Anion gap 9 5 - 15  Urine microscopic-add on  Result Value Ref Range   Squamous Epithelial / LPF MANY (A) RARE   WBC, UA 3-6 <3 WBC/hpf   RBC / HPF 0-2 <3 RBC/hpf   Bacteria, UA FEW (A) RARE   Urine-Other MUCOUS PRESENT   hCG, quantitative, pregnancy  Result Value Ref Range   hCG, Beta Chain, Quant, S 95621 (H) <5 mIU/mL  Reticulocytes  Result Value Ref Range   Retic Ct Pct 1.3 0.4 - 3.1 %   RBC. 2.21 (L) 3.87 - 5.11 MIL/uL  Retic Count, Manual 28.7 19.0 - 186.0 K/uL  Vitamin B12  Result Value Ref Range   Vitamin B-12 250 180 - 914 pg/mL  Folate  Result Value Ref Range   Folate 21.9 >5.9 ng/mL  Iron and TIBC  Result Value Ref Range   Iron 35 28 - 170 ug/dL   TIBC 324 (H) 401 - 027 ug/dL   Saturation Ratios 7 (L) 10.4 - 31.8 %   UIBC 445 ug/dL  I-Stat Chem 8 ED  (not at [facility], [facility])  Result Value Ref Range   Sodium 137 135 - 145 mmol/L   Potassium 4.3 3.5 - 5.1 mmol/L   Chloride 105 101 - 111 mmol/L   BUN 6 6 - 20 mg/dL   Creatinine, Ser 2.53 (L) 0.44 - 1.00 mg/dL   Glucose, Bld 664 (H) 65 - 99 mg/dL   Calcium, Ion 4.03 (L) 1.12 - 1.23 mmol/L   TCO2 19 0 - 100 mmol/L   Hemoglobin 21.8 (HH) 12.0 - 15.0 g/dL   HCT 47.4 (H) 25.9 - 56.3 %   Comment NOTIFIED PHYSICIAN   I-Stat Beta hCG blood, ED ([facility], [facility], [facility] only)  Result Value Ref Range   I-stat hCG, quantitative >2000.0 (H) <5 mIU/mL   Comment 3          POC occult blood, ED RN will collect  Result Value Ref Range   Fecal Occult Bld NEGATIVE NEGATIVE  Type and screen  Result Value Ref Range   ABO/RH(D) O POS    Antibody Screen NEG    Sample Expiration 07/07/2015    Unit Number [id number]    Blood Component  Type RBC LR PHER2    Unit division 00    Status of Unit ISSUED    Transfusion Status OK TO TRANSFUSE    Crossmatch Result Compatible    Unit Number [id number]    Blood Component Type RBC LR PHER1    Unit division 00    Status of Unit ISSUED    Transfusion Status OK TO TRANSFUSE    Crossmatch Result Compatible    Unit Number [id number]    Blood Component Type RBC LR PHER2    Unit division 00    Status of Unit ISSUED    Transfusion Status OK TO TRANSFUSE    Crossmatch Result Compatible   Prepare RBC  Result Value Ref Range   Order Confirmation ORDER PROCESSED BY BLOOD BANK   ABO/Rh  Result Value Ref Range   ABO/RH(D) O POS    US Ob Comp Less 14 Wks  07/04/2015   CLINICAL DATA:  Acute onset of generalized abdominal pain and vaginal spotting. Initial encounter.  EXAM: OBSTETRIC <14 WK [place] AND TRANSVAGINAL OB US  TECHNIQUE: Both transabdominal and transvaginal ultrasound examinations were performed for complete evaluation of the gestation as well as the maternal uterus, adnexal regions, and pelvic cul-de-sac. Transvaginal technique was performed to assess early pregnancy.  COMPARISON:  CT of the abdomen and pelvis performed 05/22/2011  FINDINGS: Intrauterine gestational sac: Visualized/normal in shape.  Yolk sac:  Yes  Embryo:  Yes  Cardiac Activity: Yes  Heart Rate: 107  bpm  CRL:  0.54 cm   6 w   2 d                  [place] EDC: 02/25/2016  Maternal uterus/adnexae: A small amount of subchorionic hemorrhage is noted. The uterus is otherwise unremarkable.  A large right ovarian cyst is noted, measuring 7.5 x 7.4 x  6.7 cm. This displaces surrounding structures. The right ovary measures 11.1 x 7.2 x 7.8 cm. The left ovary is unremarkable in appearance, measuring 3.0 x 2.2 x 2.1 cm. There is no evidence for ovarian torsion. No suspicious adnexal masses are seen.  Trace free fluid is seen within the pelvic cul-de-sac.  IMPRESSION: 1. Single live intrauterine pregnancy noted, with a crown-rump length  of 5 mm, corresponding to a gestational age of [redacted] weeks 2 days. This reflects an estimated date of delivery of Feb 25, 2016. 2. Small amount of subchorionic hemorrhage noted. 3. Large right ovarian cyst, measuring 7.5 cm in size.   Electronically Signed   By: Roanna Raider M.D.   On: 07/04/2015 05:37   US Ob Transvaginal  07/04/2015   CLINICAL DATA:  Acute onset of generalized abdominal pain and vaginal spotting. Initial encounter.  EXAM: OBSTETRIC <14 WK [place] AND TRANSVAGINAL OB US  TECHNIQUE: Both transabdominal and transvaginal ultrasound examinations were performed for complete evaluation of the gestation as well as the maternal uterus, adnexal regions, and pelvic cul-de-sac. Transvaginal technique was performed to assess early pregnancy.  COMPARISON:  CT of the abdomen and pelvis performed 05/22/2011  FINDINGS: Intrauterine gestational sac: Visualized/normal in shape.  Yolk sac:  Yes  Embryo:  Yes  Cardiac Activity: Yes  Heart Rate: 107  bpm  CRL:  0.54 cm   6 w   2 d                  [place] EDC: 02/25/2016  Maternal uterus/adnexae: A small amount of subchorionic hemorrhage is noted. The uterus is otherwise unremarkable.  A large right ovarian cyst is noted, measuring 7.5 x 7.4 x 6.7 cm. This displaces surrounding structures. The right ovary measures 11.1 x 7.2 x 7.8 cm. The left ovary is unremarkable in appearance, measuring 3.0 x 2.2 x 2.1 cm. There is no evidence for ovarian torsion. No suspicious adnexal masses are seen.  Trace free fluid is seen within the pelvic cul-de-sac.  IMPRESSION: 1. Single live intrauterine pregnancy noted, with a crown-rump length of 5 mm, corresponding to a gestational age of [redacted] weeks 2 days. This reflects an estimated date of delivery of Feb 25, 2016. 2. Small amount of subchorionic hemorrhage noted. 3. Large right ovarian cyst, measuring 7.5 cm in size.   Electronically Signed   By: Roanna Raider M.D.   On: 07/04/2015 05:37   US Abdomen Limited  07/04/2015   CLINICAL DATA:   Elevated lipase.  Pain.  EXAM: US ABDOMEN LIMITED - RIGHT UPPER QUADRANT  COMPARISON:  None.  FINDINGS: Gallbladder:  No gallstones or wall thickening visualized. No sonographic Murphy sign noted.  Common bile duct:  Diameter: 1.9 mm  Liver:  No focal lesion identified. Within normal limits in parenchymal echogenicity.  IMPRESSION: Normal exam.   Electronically Signed   By: Maisie Fus  Register   On: 07/04/2015 08:21   [place] Art/ven Flow Abd Pelv Doppler  07/04/2015   CLINICAL DATA:  Followup for pelvic ultrasound earlier today. Right ovarian cyst.  EXAM: DOPPLER ULTRASOUND OF OVARIES  TECHNIQUE: Color and duplex Doppler ultrasound was utilized to evaluate blood flow to the ovaries.  COMPARISON:  07/04/2015 pelvic ultrasound  FINDINGS: Pulsed Doppler evaluation of both ovaries demonstrates normal low-resistance arterial and venous waveforms.  IMPRESSION: Normal blood flow identified in both ovaries. No evidence for torsion.   Electronically Signed   By: Norva Pavlov M.D.   On: 07/04/2015 08:28      Avir Deruiter  Criss Alvine, MD 07/04/15 [phone]

## 2015-07-04 NOTE — Discharge Instructions (Signed)
YOU ARE 6 WEEKS AND 2 DAYS PREGNANT. YOU HAVE A LARGE OVARIAN CYST ON THE RIGHT SIDE. YOU HAD LOW HEMOGLOBIN - and you were given blood.  See the [facility] doctor in 1 week.  Return to ER if the symptoms are worse.   Abdominal Pain During Pregnancy Abdominal pain is common in pregnancy. Most of the time, it does not cause harm. There are many causes of abdominal pain. Some causes are more serious than others. Some of the causes of abdominal pain in pregnancy are easily diagnosed. Occasionally, the diagnosis takes time to understand. Other times, the cause is not determined. Abdominal pain can be a sign that something is very wrong with the pregnancy, or the pain may have nothing to do with the pregnancy at all. For this reason, always tell your health care provider if you have any abdominal discomfort. HOME CARE INSTRUCTIONS  Monitor your abdominal pain for any changes. The following actions may help to alleviate any discomfort you are experiencing:  Do not have sexual intercourse or put anything in your vagina until your symptoms go away completely.  Get plenty of rest until your pain improves.  Drink clear fluids if you feel nauseous. Avoid solid food as long as you are uncomfortable or nauseous.  Only take over-the-counter or prescription medicine as directed by your health care provider.  Keep all follow-up appointments with your health care provider. SEEK IMMEDIATE MEDICAL CARE IF:  You are bleeding, leaking fluid, or passing tissue from the vagina.  You have increasing pain or cramping.  You have persistent vomiting.  You have painful or bloody urination.  You have a fever.  You notice a decrease in your baby's movements.  You have extreme weakness or feel faint.  You have shortness of breath, with or without abdominal pain.  You develop a severe headache with abdominal pain.  You have abnormal vaginal discharge with abdominal pain.  You have persistent diarrhea.  You  have abdominal pain that continues even after rest, or gets worse. MAKE SURE YOU:   Understand these instructions.  Will watch your condition.  Will get help right away if you are not doing well or get worse.   This information is not intended to replace advice given to you by your health care provider. Make sure you discuss any questions you have with your health care provider.   Document Released: 09/14/2005 Document Revised: 07/05/2013 Document Reviewed: 04/13/2013 Elsevier Interactive Patient Education 2016 [employer].  Anemia, Nonspecific Anemia is a condition in which the concentration of red blood cells or hemoglobin in the blood is below normal. Hemoglobin is a substance in red blood cells that carries oxygen to the tissues of the body. Anemia results in not enough oxygen reaching these tissues.  CAUSES  Common causes of anemia include:   Excessive bleeding. Bleeding may be internal or external. This includes excessive bleeding from periods (in women) or from the intestine.   Poor nutrition.   Chronic kidney, thyroid, and liver disease.  Bone marrow disorders that decrease red blood cell production.  Cancer and treatments for cancer.  HIV, AIDS, and their treatments.  Spleen problems that increase red blood cell destruction.  Blood disorders.  Excess destruction of red blood cells due to infection, medicines, and autoimmune disorders. SIGNS AND SYMPTOMS   Minor weakness.   Dizziness.   Headache.  Palpitations.   Shortness of breath, especially with exercise.   Paleness.  Cold sensitivity.  Indigestion.  Nausea.  Difficulty sleeping.  Difficulty concentrating. Symptoms may occur suddenly or they may develop slowly.  DIAGNOSIS  Additional blood tests are often needed. These help your health care provider determine the best treatment. Your health care provider will check your stool for blood and look for other causes of blood loss.    TREATMENT  Treatment varies depending on the cause of the anemia. Treatment can include:   Supplements of iron, vitamin B12, or folic acid.   Hormone medicines.   A blood transfusion. This may be needed if blood loss is severe.   Hospitalization. This may be needed if there is significant continual blood loss.   Dietary changes.  Spleen removal. HOME CARE INSTRUCTIONS Keep all follow-up appointments. It often takes many weeks to correct anemia, and having your health care provider check on your condition and your response to treatment is very important. SEEK IMMEDIATE MEDICAL CARE IF:   You develop extreme weakness, shortness of breath, or chest pain.   You become dizzy or have trouble concentrating.  You develop heavy vaginal bleeding.   You develop a rash.   You have bloody or black, tarry stools.   You faint.   You vomit up blood.   You vomit repeatedly.   You have abdominal pain.  You have a fever or persistent symptoms for more than 2-3 days.   You have a fever and your symptoms suddenly get worse.   You are dehydrated.  MAKE SURE YOU:  Understand these instructions.  Will watch your condition.  Will get help right away if you are not doing well or get worse.   This information is not intended to replace advice given to you by your health care provider. Make sure you discuss any questions you have with your health care provider.   Document Released: 10/22/2004 Document Revised: 05/17/2013 Document Reviewed: 03/10/2013 Elsevier Interactive Patient Education [employer]  First Trimester of Pregnancy The first trimester of pregnancy is from week 1 until the end of week 12 (months 1 through 3). A week after a sperm fertilizes an egg, the egg will implant on the wall of the uterus. This embryo will begin to develop into a baby. Genes from you and your partner are forming the baby. The female genes determine whether the baby is a boy or a  girl. At 6-8 weeks, the eyes and face are formed, and the heartbeat can be seen on ultrasound. At the end of 12 weeks, all the baby's organs are formed.  Now that you are pregnant, you will want to do everything you can to have a healthy baby. Two of the most important things are to get good prenatal care and to follow your health care provider's instructions. Prenatal care is all the medical care you receive before the baby's birth. This care will help prevent, find, and treat any problems during the pregnancy and childbirth. BODY CHANGES Your body goes through many changes during pregnancy. The changes vary from woman to woman.   You may gain or lose a couple of pounds at first.  You may feel sick to your stomach (nauseous) and throw up (vomit). If the vomiting is uncontrollable, call your health care provider.  You may tire easily.  You may develop headaches that can be relieved by medicines approved by your health care provider.  You may urinate more often. Painful urination may mean you have a bladder infection.  You may develop heartburn as a result of your pregnancy.  You may develop constipation because  certain hormones are causing the muscles that push waste through your intestines to slow down.  You may develop hemorrhoids or swollen, bulging veins (varicose veins).  Your breasts may begin to grow larger and become tender. Your nipples may stick out more, and the tissue that surrounds them (areola) may become darker.  Your gums may bleed and may be sensitive to brushing and flossing.  Dark spots or blotches (chloasma, mask of pregnancy) may develop on your face. This will likely fade after the baby is born.  Your menstrual periods will stop.  You may have a loss of appetite.  You may develop cravings for certain kinds of food.  You may have changes in your emotions from day to day, such as being excited to be pregnant or being concerned that something may go wrong with the  pregnancy and baby.  You may have more vivid and strange dreams.  You may have changes in your hair. These can include thickening of your hair, rapid growth, and changes in texture. Some women also have hair loss during or after pregnancy, or hair that feels dry or thin. Your hair will most likely return to normal after your baby is born. WHAT TO EXPECT AT YOUR PRENATAL VISITS During a routine prenatal visit:  You will be weighed to make sure you and the baby are growing normally.  Your blood pressure will be taken.  Your abdomen will be measured to track your baby's growth.  The fetal heartbeat will be listened to starting around week 10 or 12 of your pregnancy.  Test results from any previous visits will be discussed. Your health care provider may ask you:  How you are feeling.  If you are feeling the baby move.  If you have had any abnormal symptoms, such as leaking fluid, bleeding, severe headaches, or abdominal cramping.  If you are using any tobacco products, including cigarettes, chewing tobacco, and electronic cigarettes.  If you have any questions. Other tests that may be performed during your first trimester include:  Blood tests to find your blood type and to check for the presence of any previous infections. They will also be used to check for low iron levels (anemia) and Rh antibodies. Later in the pregnancy, blood tests for diabetes will be done along with other tests if problems develop.  Urine tests to check for infections, diabetes, or protein in the urine.  An ultrasound to confirm the proper growth and development of the baby.  An amniocentesis to check for possible genetic problems.  Fetal screens for spina bifida and Down syndrome.  You may need other tests to make sure you and the baby are doing well.  HIV (human immunodeficiency virus) testing. Routine prenatal testing includes screening for HIV, unless you choose not to have this test. HOME CARE  INSTRUCTIONS  Medicines  Follow your health care provider's instructions regarding medicine use. Specific medicines may be either safe or unsafe to take during pregnancy.  Take your prenatal vitamins as directed.  If you develop constipation, try taking a stool softener if your health care provider approves. Diet  Eat regular, well-balanced meals. Choose a variety of foods, such as meat or vegetable-based protein, fish, milk and low-fat dairy products, vegetables, fruits, and whole grain breads and cereals. Your health care provider will help you determine the amount of weight gain that is right for you.  Avoid raw meat and uncooked cheese. These carry germs that can cause birth defects in the baby.  Eating  four or five small meals rather than three large meals a day may help relieve nausea and vomiting. If you start to feel nauseous, eating a few soda crackers can be helpful. Drinking liquids between meals instead of during meals also seems to help nausea and vomiting.  If you develop constipation, eat more high-fiber foods, such as fresh vegetables or fruit and whole grains. Drink enough fluids to keep your urine clear or pale yellow. Activity and Exercise  Exercise only as directed by your health care provider. Exercising will help you:  Control your weight.  Stay in shape.  Be prepared for labor and delivery.  Experiencing pain or cramping in the lower abdomen or low back is a good sign that you should stop exercising. Check with your health care provider before continuing normal exercises.  Try to avoid standing for long periods of time. Move your legs often if you must stand in one place for a long time.  Avoid heavy lifting.  Wear low-heeled shoes, and practice good posture.  You may continue to have sex unless your health care provider directs you otherwise. Relief of Pain or Discomfort  Wear a good support bra for breast tenderness.   Take warm sitz baths to soothe  any pain or discomfort caused by hemorrhoids. Use hemorrhoid cream if your health care provider approves.   Rest with your legs elevated if you have leg cramps or low back pain.  If you develop varicose veins in your legs, wear support hose. Elevate your feet for 15 minutes, 3-4 times a day. Limit salt in your diet. Prenatal Care  Schedule your prenatal visits by the twelfth week of pregnancy. They are usually scheduled monthly at first, then more often in the last 2 months before delivery.  Write down your questions. Take them to your prenatal visits.  Keep all your prenatal visits as directed by your health care provider. Safety  Wear your seat belt at all times when driving.  Make a list of emergency phone numbers, including numbers for family, friends, the hospital, and police and fire departments. General Tips  Ask your health care provider for a referral to a local prenatal education class. Begin classes no later than at the beginning of month 6 of your pregnancy.  Ask for help if you have counseling or nutritional needs during pregnancy. Your health care provider can offer advice or refer you to specialists for help with various needs.  Do not use hot tubs, steam rooms, or saunas.  Do not douche or use tampons or scented sanitary pads.  Do not cross your legs for long periods of time.  Avoid cat litter boxes and soil used by cats. These carry germs that can cause birth defects in the baby and possibly loss of the fetus by miscarriage or stillbirth.  Avoid all smoking, herbs, alcohol, and medicines not prescribed by your health care provider. Chemicals in these affect the formation and growth of the baby.  Do not use any tobacco products, including cigarettes, chewing tobacco, and electronic cigarettes. If you need help quitting, ask your health care provider. You may receive counseling support and other resources to help you quit.  Schedule a dentist appointment. At home,  brush your teeth with a soft toothbrush and be gentle when you floss. SEEK MEDICAL CARE IF:   You have dizziness.  You have mild pelvic cramps, pelvic pressure, or nagging pain in the abdominal area.  You have persistent nausea, vomiting, or diarrhea.  You have  a bad smelling vaginal discharge.  You have pain with urination.  You notice increased swelling in your face, hands, legs, or ankles. SEEK IMMEDIATE MEDICAL CARE IF:   You have a fever.  You are leaking fluid from your vagina.  You have spotting or bleeding from your vagina.  You have severe abdominal cramping or pain.  You have rapid weight gain or loss.  You vomit blood or material that looks like coffee grounds.  You are exposed to [place] measles and have never had them.  You are exposed to fifth disease or chickenpox.  You develop a severe headache.  You have shortness of breath.  You have any kind of trauma, such as from a fall or a car accident.   This information is not intended to replace advice given to you by your health care provider. Make sure you discuss any questions you have with your health care provider.   Document Released: 09/08/2001 Document Revised: 10/05/2014 Document Reviewed: 07/25/2013 Elsevier Interactive Patient Education [employer]

## 2015-07-04 NOTE — ED Notes (Signed)
Pt states that her stomach started hurting about 8pm, she didn't eat anything different and hasn't vomited. She's rubbing on the center of her abdomen and moaning, pt's friend is doing all the talking

## 2015-07-05 LAB — TYPE AND SCREEN
ABO/RH(D): O POS
Antibody Screen: NEGATIVE
UNIT DIVISION: 0
UNIT DIVISION: 0
Unit division: 0

## 2015-07-26 ENCOUNTER — Encounter: Payer: Self-pay | Admitting: *Deleted

## 2015-07-26 DIAGNOSIS — O99019 Anemia complicating pregnancy, unspecified trimester: Secondary | ICD-10-CM

## 2015-07-26 DIAGNOSIS — O0990 Supervision of high risk pregnancy, unspecified, unspecified trimester: Secondary | ICD-10-CM

## 2015-07-26 DIAGNOSIS — N83209 Unspecified ovarian cyst, unspecified side: Secondary | ICD-10-CM

## 2015-07-26 DIAGNOSIS — O34219 Maternal care for unspecified type scar from previous cesarean delivery: Secondary | ICD-10-CM | POA: Insufficient documentation

## 2015-07-26 DIAGNOSIS — O3480 Maternal care for other abnormalities of pelvic organs, unspecified trimester: Secondary | ICD-10-CM

## 2015-07-26 HISTORY — DX: Anemia complicating pregnancy, unspecified trimester: O99.019

## 2015-07-26 HISTORY — DX: Supervision of high risk pregnancy, unspecified, unspecified trimester: O09.90

## 2015-07-26 HISTORY — DX: Unspecified ovarian cyst, unspecified side: N83.209

## 2015-08-06 ENCOUNTER — Encounter: Payer: Medicaid Other | Admitting: Advanced Practice Midwife

## 2015-08-06 ENCOUNTER — Encounter: Payer: Self-pay | Admitting: Advanced Practice Midwife

## 2015-08-14 ENCOUNTER — Encounter: Payer: Medicaid Other | Admitting: Advanced Practice Midwife

## 2015-08-15 ENCOUNTER — Encounter: Payer: Self-pay | Admitting: Physician Assistant

## 2015-08-15 ENCOUNTER — Ambulatory Visit (INDEPENDENT_AMBULATORY_CARE_PROVIDER_SITE_OTHER): Payer: Medicaid Other | Admitting: Physician Assistant

## 2015-08-15 ENCOUNTER — Other Ambulatory Visit (HOSPITAL_COMMUNITY)
Admission: RE | Admit: 2015-08-15 | Discharge: 2015-08-15 | Disposition: A | Discharge status: Home / Self Care | Payer: Medicaid Other | Source: Ambulatory Visit | Attending: Physician Assistant | Admitting: Physician Assistant

## 2015-08-15 VITALS — BP 121/83 | HR 78 | Ht 63.0 in | Wt 173.8 lb

## 2015-08-15 DIAGNOSIS — O99011 Anemia complicating pregnancy, first trimester: Secondary | ICD-10-CM

## 2015-08-15 DIAGNOSIS — O09299 Supervision of pregnancy with other poor reproductive or obstetric history, unspecified trimester: Secondary | ICD-10-CM | POA: Insufficient documentation

## 2015-08-15 DIAGNOSIS — Z789 Other specified health status: Secondary | ICD-10-CM | POA: Insufficient documentation

## 2015-08-15 DIAGNOSIS — Z758 Other problems related to medical facilities and other health care: Secondary | ICD-10-CM

## 2015-08-15 DIAGNOSIS — O09291 Supervision of pregnancy with other poor reproductive or obstetric history, first trimester: Secondary | ICD-10-CM

## 2015-08-15 DIAGNOSIS — O0991 Supervision of high risk pregnancy, unspecified, first trimester: Secondary | ICD-10-CM

## 2015-08-15 DIAGNOSIS — D649 Anemia, unspecified: Secondary | ICD-10-CM

## 2015-08-15 DIAGNOSIS — O34219 Maternal care for unspecified type scar from previous cesarean delivery: Secondary | ICD-10-CM | POA: Diagnosis not present

## 2015-08-15 DIAGNOSIS — Z113 Encounter for screening for infections with a predominantly sexual mode of transmission: Secondary | ICD-10-CM | POA: Diagnosis not present

## 2015-08-15 DIAGNOSIS — O219 Vomiting of pregnancy, unspecified: Secondary | ICD-10-CM

## 2015-08-15 DIAGNOSIS — O09521 Supervision of elderly multigravida, first trimester: Secondary | ICD-10-CM | POA: Diagnosis not present

## 2015-08-15 DIAGNOSIS — O09529 Supervision of elderly multigravida, unspecified trimester: Secondary | ICD-10-CM | POA: Insufficient documentation

## 2015-08-15 DIAGNOSIS — Z8632 Personal history of gestational diabetes: Secondary | ICD-10-CM

## 2015-08-15 HISTORY — DX: Supervision of pregnancy with other poor reproductive or obstetric history, unspecified trimester: O09.299

## 2015-08-15 LAB — POCT URINALYSIS DIP (DEVICE)
BILIRUBIN URINE: NEGATIVE
Glucose, UA: NEGATIVE mg/dL
KETONES UR: NEGATIVE mg/dL
Nitrite: NEGATIVE
PH: 5 (ref 5.0–8.0)
Protein, ur: NEGATIVE mg/dL
Specific Gravity, Urine: >=1.03 (ref 1.005–1.030)
Urobilinogen, UA: 0.2 mg/dL (ref 0.0–1.0)

## 2015-08-15 MED ORDER — PRENATAL VITAMINS PLUS 27-1 MG PO TABS
1.0000 | ORAL_TABLET | Freq: Every day | ORAL | Status: DC
Start: 2015-08-15 — End: 2015-11-27

## 2015-08-15 MED ORDER — METOCLOPRAMIDE HCL 10 MG PO TABS: 10.0000 mg | ORAL_TABLET | Freq: Three times a day (TID) | ORAL | Status: DC | PRN

## 2015-08-15 NOTE — Progress Notes (Signed)
Patient requests medication for nausea Lindsey Molina used for interpreter

## 2015-08-15 NOTE — Patient Instructions (Addendum)
Prenatal Care °WHAT IS PRENATAL CARE?  °Prenatal care is the process of caring for a pregnant woman before she gives birth. Prenatal care makes sure that she and her baby remain as healthy as possible throughout pregnancy. Prenatal care may be provided by a midwife, family practice health care provider, or a childbirth and pregnancy specialist (obstetrician). Prenatal care may include physical examinations, testing, treatments, and education on nutrition, lifestyle, and social support services. °WHY IS PRENATAL CARE SO IMPORTANT?  °Early and consistent prenatal care increases the chance that you and your baby will remain healthy throughout your pregnancy. This type of care also decreases a baby's risk of being born too early (prematurely), or being born smaller than expected (small for gestational age). Any underlying medical conditions you may have that could pose a risk during your pregnancy are discussed during prenatal care visits. You will also be monitored regularly for any new conditions that may arise during your pregnancy so they can be treated quickly and effectively. °WHAT HAPPENS DURING PRENATAL CARE VISITS? °Prenatal care visits may include the following: °Discussion °Tell your health care provider about any new signs or symptoms you have experienced since your last visit. These might include: °· Nausea or vomiting. °· Increased or decreased level of energy. °· Difficulty sleeping. °· Back or leg pain. °· Weight changes. °· Frequent urination. °· Shortness of breath with physical activity. °· Changes in your skin, such as the development of a rash or itchiness. °· Vaginal discharge or bleeding. °· Feelings of excitement or nervousness. °· Changes in your baby's movements. °You may want to write down any questions or topics you want to discuss with your health care provider and bring them with you to your appointment. °Examination °During your first prenatal care visit, you will likely have a complete  physical exam. Your health care provider will often examine your vagina, cervix, and the position of your uterus, as well as check your heart, lungs, and other body systems. As your pregnancy progresses, your health care provider will measure the size of your uterus and your baby's position inside your uterus. He or she may also examine you for early signs of labor. Your prenatal visits may also include checking your blood pressure and, after about 10-12 weeks of pregnancy, listening to your baby's heartbeat. °Testing °Regular testing often includes: °· Urinalysis. This checks your urine for glucose, protein, or signs of infection. °· Blood count. This checks the levels of white and red blood cells in your body. °· Tests for sexually transmitted infections (STIs). Testing for STIs at the beginning of pregnancy is routinely done and is required in many states. °· Antibody testing. You will be checked to see if you are immune to certain illnesses, such as rubella, that can affect a developing fetus. °· Glucose screen. Around 24-28 weeks of pregnancy, your blood glucose level will be checked for signs of gestational diabetes. Follow-up tests may be recommended. °· Group B strep. This is a bacteria that is commonly found inside a woman's vagina. This test will inform your health care provider if you need an antibiotic to reduce the amount of this bacteria in your body prior to labor and childbirth. °· Ultrasound. Many pregnant women undergo an ultrasound screening around 18-20 weeks of pregnancy to evaluate the health of the fetus and check for any developmental abnormalities. °· HIV (human immunodeficiency virus) testing. Early in your pregnancy, you will be screened for HIV. If you are at high risk for HIV, this test   may be repeated during your third trimester of pregnancy. °You may be offered other testing based on your age, personal or family medical history, or other factors.  °HOW OFTEN SHOULD I PLAN TO SEE MY  HEALTH CARE PROVIDER FOR PRENATAL CARE? °Your prenatal care check-up schedule depends on any medical conditions you have before, or develop during, your pregnancy. If you do not have any underlying medical conditions, you will likely be seen for checkups: °· Monthly, during the first 6 months of pregnancy. °· Twice a month during months 7 and 8 of pregnancy. °· Weekly starting in the 9th month of pregnancy and until delivery. °If you develop signs of early labor or other concerning signs or symptoms, you may need to see your health care provider more often. Ask your health care provider what prenatal care schedule is best for you. °WHAT CAN I DO TO KEEP MYSELF AND MY BABY AS HEALTHY AS POSSIBLE DURING MY PREGNANCY? °· Take a prenatal vitamin containing 400 micrograms (0.4 mg) of folic acid every day. Your health care provider may also ask you to take additional vitamins such as iodine, vitamin D, iron, copper, and zinc. °· Take 1500-2000 mg of calcium daily starting at your 20th week of pregnancy until you deliver your baby. °· Make sure you are up to date on your vaccinations. Unless directed otherwise by your health care provider: °¨ You should receive a tetanus, diphtheria, and pertussis (Tdap) vaccination between the 27th and 36th week of your pregnancy, regardless of when your last Tdap immunization occurred. This helps protect your baby from whooping cough (pertussis) after he or she is born. °¨ You should receive an annual inactivated influenza vaccine (IIV) to help protect you and your baby from influenza. This can be done at any point during your pregnancy. °· Eat a well-rounded diet that includes: °¨ Fresh fruits and vegetables. °¨ Lean proteins. °¨ Calcium-rich foods such as milk, yogurt, hard cheeses, and dark, leafy greens. °¨ Whole grain breads. °· Do not eat seafood high in mercury, including: °¨ Swordfish. °¨ Tilefish. °¨ Shark. °¨ King mackerel. °¨ More than 6 oz tuna per week. °· Do not eat: °¨ Raw  or undercooked meats or eggs. °¨ Unpasteurized foods, such as soft cheeses (brie, blue, or feta), juices, and milks. °¨ Lunch meats. °¨ Hot dogs that have not been heated until they are steaming. °· Drink enough water to keep your urine clear or pale yellow. For many women, this may be 10 or more 8 oz glasses of water each day. Keeping yourself hydrated helps deliver nutrients to your baby and may prevent the start of pre-term uterine contractions. °· Do not use any tobacco products including cigarettes, chewing tobacco, or electronic cigarettes. If you need help quitting, ask your health care provider. °· Do not drink beverages containing alcohol. No safe level of alcohol consumption during pregnancy has been determined. °· Do not use any illegal drugs. These can harm your developing baby or cause a miscarriage. °· Ask your health care provider or pharmacist before taking any prescription or over-the-counter medicines, herbs, or supplements. °· Limit your caffeine intake to no more than 200 mg per day. °· Exercise. Unless told otherwise by your health care provider, try to get 30 minutes of moderate exercise most days of the week. Do not  do high-impact activities, contact sports, or activities with a high risk of falling, such as horseback riding or downhill skiing. °· Get plenty of rest. °· Avoid anything that raises your   body temperature, such as hot tubs and saunas.  If you own a cat, do not empty its litter box. Bacteria contained in cat feces can cause an infection called toxoplasmosis. This can result in serious harm to the fetus.  Stay away from chemicals such as insecticides, lead, mercury, and cleaning or paint products that contain solvents.  Do not have any X-rays taken unless medically necessary.  Take a childbirth and breastfeeding preparation class. Ask your health care provider if you need a referral or recommendation.   This information is not intended to replace advice given to you by  your health care provider. Make sure you discuss any questions you have with your health care provider.   Document Released: 09/17/2003 Document Revised: 10/05/2014 Document Reviewed: 11/29/2013 Elsevier Interactive Patient Education 2016 [employer]   Nausea medication to take during pregnancy:   Unisom (doxylamine succinate 25 mg tablets) Take one tablet daily at bedtime. If symptoms are not adequately controlled, the dose can be increased to a maximum recommended dose of two tablets daily (1/2 tablet in the morning, 1/2 tablet mid-afternoon and one at bedtime).  Vitamin B6 100mg  tablets. Take one tablet twice a day (up to 200 mg per day).

## 2015-08-15 NOTE — Progress Notes (Signed)
Subjective:    Lindsey Molina is a [id number] 7518w2d being seen today for her first obstetrical visit.  Her obstetrical history is significant for advanced maternal age. Patient does intend to breast feed. Pregnancy history fully reviewed.  Patient reports nausea and vomiting.  Filed Vitals:   08/15/15 1415 08/15/15 1419  BP: 121/83   Pulse: 78   Height:  5\' 3"  (1.6 m)  Weight: 173 lb 12.8 oz (78.835 kg)     HISTORY: OB History  Gravida Para Term Preterm AB SAB TAB Ectopic Multiple Living  3 2 2  0 0 0 0 0 0 2    # Outcome Date GA Lbr Len/2nd Weight Sex Delivery Anes PTL Lv  3 Current           2 Term 06/23/10 463w0d  6 lb 9.8 oz (3 kg) F CS-Unspec Spinal N Y     Complications: Gestational diabetes  1 Term 05/01/04 5062w0d  6 lb 9.8 oz (3 kg) F CS-Unspec EPI N Y     Complications: Failure to Progress in Second Stage     Past Medical History  Diagnosis Date  . Syncope and collapse 2011; 05/24/2012  . Hypotension 05/25/2012  . Dizziness 05/25/2012    "severe; and I pass out"  . Shortness of breath 05/25/2012    "occurs when I pass out"  . Respiratory arrest (HCC) 05/25/2012    "occurs when I black out; I can't breath"  . Numbness and tingling 05/25/2012    "and pain right arm when I get mad,  nervous and/or blackout; sometimes; not always"  . 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 Cesarean section  2005; 2011   History reviewed. No pertinent family history.   Exam                                      System:     Skin: normal coloration and turgor, no rashes    Neurologic: oriented, normal mood, grossly non-focal, CN II-XII intact   Extremities: normal strength, tone, and muscle mass, no deformities   HEENT extra ocular movement intact, neck supple with midline trachea and thyroid without masses   Mouth/Teeth mucous membranes moist, pharynx normal without lesions and dental hygiene good   Neck supple and no masses   Cardiovascular: regular rate and rhythm, no murmurs or gallops   Respiratory:  appears well, vitals normal, no respiratory distress, acyanotic, normal RR, ear and throat exam is normal, neck free of mass or lymphadenopathy, chest clear, no wheezing, crepitations, rhonchi, normal symmetric air entry   Abdomen: soft, non-tender; bowel sounds normal; no masses,  no organomegaly          Assessment:    Pregnancy: [id number] Patient Active Problem List   Diagnosis Date Noted  . Language barrier affecting health care 08/15/2015  . Anemia of mother in pregnancy, antepartum 07/26/2015  . Ovarian cyst in pregnancy 07/26/2015  . Supervision of high risk pregnancy, antepartum 07/26/2015  . Previous cesarean delivery, antepartum 07/26/2015  . 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 Syncope 05/25/2012  . Hypokalemia 05/25/2012  . Orthostasis 05/25/2012  . Abnormal finding on EKG 05/25/2012  h/o gestational diabetes in prior pregnancy AMA      Plan:     Initial labs drawn.  Recent anemia requiring transfusion Prenatal vitamins. Problem list reviewed and updated. Genetic Screening discussed First Screen:  ordered.  Ultrasound discussed; fetal survey: ordered.  Follow up in 4 weeks. GTT done today with h/o Gestational Diabetes 100% of 90 min visit spent on counseling and coordination of care.  Rx for Reglan provided  Nausea medication to take during pregnancy:   Unisom (doxylamine succinate 25 mg tablets) Take one tablet daily at bedtime. If symptoms are not adequately controlled, the dose can be increased to a maximum recommended dose of two tablets daily (1/2 tablet in the morning, 1/2 tablet mid-afternoon and one at bedtime).  Vitamin B6  tablets. Take one tablet twice a day (up to 200 mg per day).    Duane Boston Clark 08/15/2015

## 2015-08-16 LAB — PRESCRIPTION MONITORING PROFILE (19 PANEL)
AMPHETAMINE/METH: NEGATIVE ng/mL
Barbiturate Screen, Urine: NEGATIVE ng/mL
Benzodiazepine Screen, Urine: NEGATIVE ng/mL
Buprenorphine, Urine: NEGATIVE ng/mL
Cannabinoid Scrn, Ur: NEGATIVE ng/mL
Carisoprodol, Urine: NEGATIVE ng/mL
Cocaine Metabolites: NEGATIVE ng/mL
Creatinine, Urine: 142.72 mg/dL (ref >20.0)
ECSTASY: NEGATIVE ng/mL
FENTANYL URINE: NEGATIVE ng/mL
MEPERIDINE UR: NEGATIVE ng/mL
METHADONE SCREEN, URINE: NEGATIVE ng/mL
Methaqualone: NEGATIVE ng/mL
NITRITES URINE, INITIAL: NEGATIVE ug/mL
Opiate Screen, Urine: NEGATIVE ng/mL
Oxycodone Screen, Ur: NEGATIVE ng/mL
PROPOXYPHENE: NEGATIVE ng/mL
Phencyclidine, Ur: NEGATIVE ng/mL
TAPENTADOLUR: NEGATIVE ng/mL
TRAMADOL UR: NEGATIVE ng/mL
Zolpidem, Urine: NEGATIVE ng/mL
pH, Initial: 4.7 pH (ref 4.5–8.9)

## 2015-08-16 LAB — PRENATAL PROFILE (SOLSTAS)
ANTIBODY SCREEN: NEGATIVE
BASOS ABS: 0 10*3/uL (ref 0.0–0.1)
BASOS PCT: 0 % (ref 0–1)
Eosinophils Absolute: 0 10*3/uL (ref 0.0–0.7)
Eosinophils Relative: 0 % (ref 0–5)
HCT: 37.9 % (ref 36.0–46.0)
HEMOGLOBIN: 12.5 g/dL (ref 12.0–15.0)
HEP B S AG: NEGATIVE
HIV 1&2 Ab, 4th Generation: NONREACTIVE
LYMPHS PCT: 13 % (ref 12–46)
Lymphs Abs: 1 10*3/uL (ref 0.7–4.0)
MCH: 26.7 pg (ref 26.0–34.0)
MCHC: 33 g/dL (ref 30.0–36.0)
MCV: 81 fL (ref 78.0–100.0)
Monocytes Absolute: 0.3 10*3/uL (ref 0.1–1.0)
Monocytes Relative: 4 % (ref 3–12)
NEUTROS ABS: 6.6 10*3/uL (ref 1.7–7.7)
NEUTROS PCT: 83 % — AB (ref 43–77)
Platelets: 144 10*3/uL — ABNORMAL LOW (ref 150–400)
RBC: 4.68 MIL/uL (ref 3.87–5.11)
RDW: 19.2 % — ABNORMAL HIGH (ref 11.5–15.5)
RUBELLA: 10.2 {index} — AB (ref <0.90)
Rh Type: POSITIVE
WBC: 8 10*3/uL (ref 4.0–10.5)

## 2015-08-16 LAB — GLUCOSE TOLERANCE, 1 HOUR (50G) W/O FASTING: GLUCOSE 1 HOUR GTT: 169 mg/dL — AB (ref 70–140)

## 2015-08-16 LAB — CULTURE, OB URINE: Colony Count: 50000

## 2015-08-16 LAB — GC/CHLAMYDIA PROBE AMP (~~LOC~~) NOT AT ARMC
CHLAMYDIA, DNA PROBE: NEGATIVE
Neisseria Gonorrhea: NEGATIVE

## 2015-08-19 ENCOUNTER — Telehealth: Payer: Self-pay

## 2015-08-19 LAB — HEMOGLOBINOPATHY EVALUATION
HEMOGLOBIN OTHER: 0 %
HGB A: 97.4 % (ref 96.8–97.8)
HGB F QUANT: 0 % (ref 0.0–2.0)
HGB S QUANTITAION: 0 %
Hgb A2 Quant: 2.6 % (ref 2.2–3.2)

## 2015-08-19 NOTE — Telephone Encounter (Signed)
Per Donnella BiKaren Teague pt failed 1 hr and needs 3hr gtt.

## 2015-08-23 ENCOUNTER — Other Ambulatory Visit: Payer: Self-pay | Admitting: General Practice

## 2015-08-23 ENCOUNTER — Encounter (HOSPITAL_COMMUNITY): Payer: Self-pay

## 2015-08-23 ENCOUNTER — Ambulatory Visit (HOSPITAL_COMMUNITY)
Admission: RE | Admit: 2015-08-23 | Discharge: 2015-08-23 | Disposition: A | Discharge status: Home / Self Care | Payer: Medicaid Other | Source: Ambulatory Visit | Attending: Physician Assistant | Admitting: Physician Assistant

## 2015-08-23 DIAGNOSIS — Z3A13 13 weeks gestation of pregnancy: Secondary | ICD-10-CM

## 2015-08-23 DIAGNOSIS — O09291 Supervision of pregnancy with other poor reproductive or obstetric history, first trimester: Secondary | ICD-10-CM | POA: Diagnosis not present

## 2015-08-23 DIAGNOSIS — O09521 Supervision of elderly multigravida, first trimester: Secondary | ICD-10-CM

## 2015-08-23 DIAGNOSIS — Z369 Encounter for antenatal screening, unspecified: Secondary | ICD-10-CM

## 2015-08-23 DIAGNOSIS — Z315 Encounter for genetic counseling: Secondary | ICD-10-CM | POA: Diagnosis not present

## 2015-08-23 DIAGNOSIS — O34219 Maternal care for unspecified type scar from previous cesarean delivery: Secondary | ICD-10-CM

## 2015-08-23 DIAGNOSIS — Z36 Encounter for antenatal screening of mother: Secondary | ICD-10-CM | POA: Insufficient documentation

## 2015-08-23 DIAGNOSIS — Z3689 Encounter for other specified antenatal screening: Secondary | ICD-10-CM

## 2015-08-23 DIAGNOSIS — O99011 Anemia complicating pregnancy, first trimester: Secondary | ICD-10-CM

## 2015-08-23 DIAGNOSIS — O09522 Supervision of elderly multigravida, second trimester: Secondary | ICD-10-CM

## 2015-08-23 NOTE — Progress Notes (Signed)
Genetic Counseling  High-Risk Gestation Note  Appointment Date:  08/23/2015 Referred By: Glyn Adeeague Clark, Scot JunKaren E, * Date of Birth:  09/07/1976 Partner:  Lafe GarinLaouni Lemrhari   Pregnancy History: [id number]: G3P2002 Estimated Date of Delivery: 02/25/16 Estimated Gestational Age: 8311w3d Attending: Alpha GulaPaul Whitecar, MD   Mrs. Maurice SmallMina Polkowski was seen for genetic counseling because of a maternal age of 39 y.o..  [facility] Interpreter, Mack HookKhadijah, provided interpretation for today's visit.   In Summary:  Approximate 1 in 17 risk for fetal aneuploidy related to maternal age  Nuchal translucency ultrasound performed today; within normal limits  Patient elected to pursue NIPS (Panorama); declined invasive testing  Detailed ultrasound scheduled for 09/27/15   Advanced paternal age (41 years old); also consider third trimester ultrasound to assess fetal growth  She was counseled regarding maternal age and the association with risk for chromosome conditions due to nondisjunction with aging of the ova.   We reviewed chromosomes, nondisjunction, and the associated 1 in 17 risk for fetal aneuploidy at 1711w3d gestation related to a maternal age of 39 years old at delivery.  She was counseled that the risk for aneuploidy decreases as gestational age increases, accounting for those pregnancies which spontaneously abort.  We specifically discussed Down syndrome (trisomy 2921), trisomies 213 and 6218, and sex chromosome aneuploidies (47,XXX and 47,XXY) including the common features and prognoses of each.   We reviewed available screening options including First Screen, Quad screen, noninvasive prenatal screening (NIPS)/cell free DNA (cfDNA) testing, and detailed ultrasound.  She was counseled that screening tests are used to modify a patient's a priori risk for aneuploidy, typically based on age. This estimate provides a pregnancy specific risk assessment. We reviewed the benefits and limitations of each option. Specifically, we  discussed the conditions for which each test screens, the detection rates, and false positive rates of each. She was also counseled regarding diagnostic testing via amniocentesis. We reviewed the approximate 1 in 300-500 risk for complications for amniocentesis, including spontaneous pregnancy loss. After consideration of all the options, she elected to proceed with NIPS (Panorama).  Those results will be available in 8-10 days.  She declined amniocentesis. She requested that her husband be contacted with results of NIPS.   She also expressed interest in pursuing a nuchal translucency ultrasound, which was performed today.  The report will be documented separately.  The patient would like to return for a detailed ultrasound at ~18+ weeks gestation.  This appointment was scheduled for 09/27/15. She understands that screening tests cannot rule out all birth defects or genetic syndromes. The patient was advised of this limitation and states she still does not want additional testing at this time.   Ms. Maurice SmallMina Lana was provided with written information regarding cystic fibrosis (CF) including the carrier frequency and incidence in the Caucasian population, the availability of carrier testing and prenatal diagnosis if indicated.  In addition, we discussed that CF is routinely screened for as part of the [place] newborn screening panel.  She declined CF testing today.   Both family histories were reviewed and found to be noncontributory for birth defects, intellectual disability, and known genetic conditions. Without further information regarding the provided family history, an accurate genetic risk cannot be calculated. Further genetic counseling is warranted if more information is obtained.  She reported that the father of the pregnancy is 41 years old. Advanced paternal age (APA) is defined as paternal age greater than or equal to age 945.  Recent large-scale sequencing studies have shown that approximately 80%  of de novo point mutations are of paternal origin.  Many studies have demonstrated a strong correlation between increased paternal age and de novo point mutations.  Although no specific data is available regarding fetal risks for fathers 68+ years old at conception, it is apparent that the overall risk for single gene conditions is increased.  To estimate the relative increase in risk of a genetic disorder with APA, the heritability of the disease must be considered.  Assuming an approximate 2x increase in risk for conditions that are exclusively paternal in origin, the risk for each individual condition is still relatively low.  It is estimated that the overall chance for a de novo mutation is ~0.5%.  There is a wide range of conditions which can be caused by new dominant gene mutations (achondroplasia, neurofibromatosis, Marfan syndrome etc.).  Genetic testing for each individual single gene condition is not warranted or available unless ultrasound or family history concerns lend suspicion to a specific condition. We discussed the recommendation for a detailed ultrasound at 18+ weeks gestation and a follow up ultrasound at ~28 weeks to monitor fetal growth.  Mrs. Kawa denied exposure to environmental toxins or chemical agents. She denied the use of alcohol, tobacco or street drugs. She denied significant viral illnesses during the course of her pregnancy. Her medical and surgical histories were noncontributory.   I counseled Ms. Simren Hoben regarding the above risks and available options.  The approximate face-to-face time with the genetic counselor was 40 minutes.  Quinn Plowman, MS,  Certified Genetic Counselor 08/23/2015

## 2015-09-03 ENCOUNTER — Telehealth (HOSPITAL_COMMUNITY): Payer: Self-pay | Admitting: MS"

## 2015-09-03 ENCOUNTER — Other Ambulatory Visit (HOSPITAL_COMMUNITY): Payer: Self-pay

## 2015-09-03 NOTE — Telephone Encounter (Signed)
Attempted to call patient regarding prenatal cell free DNA testing (Panorama) results which are within normal range. First attempted to call patient's husband, per her previous request, but the number listed for him is incorrect. Left message at number listed for the patient for her to return call.   Lindsey BraunKaren Emillio Ngo 09/03/2015 1:58 PM

## 2015-09-11 ENCOUNTER — Ambulatory Visit (INDEPENDENT_AMBULATORY_CARE_PROVIDER_SITE_OTHER): Payer: Medicaid Other | Admitting: Family

## 2015-09-11 VITALS — BP 121/74 | HR 83 | Temp 98.6°F | Wt 178.6 lb

## 2015-09-11 DIAGNOSIS — O09522 Supervision of elderly multigravida, second trimester: Secondary | ICD-10-CM

## 2015-09-11 DIAGNOSIS — O0992 Supervision of high risk pregnancy, unspecified, second trimester: Secondary | ICD-10-CM

## 2015-09-11 DIAGNOSIS — R1013 Epigastric pain: Secondary | ICD-10-CM

## 2015-09-11 DIAGNOSIS — O34219 Maternal care for unspecified type scar from previous cesarean delivery: Secondary | ICD-10-CM

## 2015-09-11 LAB — COMPREHENSIVE METABOLIC PANEL
ALBUMIN: 3.7 g/dL (ref 3.6–5.1)
ALK PHOS: 49 U/L (ref 33–115)
ALT: 10 U/L (ref 6–29)
AST: 13 U/L (ref 10–30)
BILIRUBIN TOTAL: 0.4 mg/dL (ref 0.2–1.2)
BUN: 5 mg/dL — ABNORMAL LOW (ref 7–25)
CHLORIDE: 104 mmol/L (ref 98–110)
CO2: 24 mmol/L (ref 20–31)
CREATININE: 0.38 mg/dL — AB (ref 0.50–1.10)
Calcium: 8.8 mg/dL (ref 8.6–10.2)
Glucose, Bld: 67 mg/dL (ref 65–99)
Potassium: 3.8 mmol/L (ref 3.5–5.3)
SODIUM: 138 mmol/L (ref 135–146)
Total Protein: 7 g/dL (ref 6.1–8.1)

## 2015-09-11 LAB — POCT URINALYSIS DIP (DEVICE)
GLUCOSE, UA: NEGATIVE mg/dL
Leukocytes, UA: NEGATIVE
Nitrite: NEGATIVE
PROTEIN: 30 mg/dL — AB
Specific Gravity, Urine: >=1.03 (ref 1.005–1.030)
UROBILINOGEN UA: 0.2 mg/dL (ref 0.0–1.0)
pH: 5.5 (ref 5.0–8.0)

## 2015-09-11 LAB — CBC
HEMATOCRIT: 36.6 % (ref 36.0–46.0)
Hemoglobin: 12.1 g/dL (ref 12.0–15.0)
MCH: 27.7 pg (ref 26.0–34.0)
MCHC: 33.1 g/dL (ref 30.0–36.0)
MCV: 83.8 fL (ref 78.0–100.0)
Platelets: 140 10*3/uL — ABNORMAL LOW (ref 150–400)
RBC: 4.37 MIL/uL (ref 3.87–5.11)
RDW: 18.5 % — AB (ref 11.5–15.5)
WBC: 6.7 10*3/uL (ref 4.0–10.5)

## 2015-09-11 LAB — AMYLASE: Amylase: 81 U/L (ref 0–105)

## 2015-09-11 LAB — LIPASE: Lipase: 21 U/L (ref 7–60)

## 2015-09-11 NOTE — Progress Notes (Signed)
Interpreter Fercyal Yousef  Left sided breast pain; pulling sensation in epigastric area  Educated pt on [place]

## 2015-09-11 NOTE — Progress Notes (Signed)
Subjective:  Lindsey Molina is a 39 y.o. G3P2002 at 56w1dbeing seen today for ongoing prenatal care.  She is currently monitored for the following issues for this high-risk pregnancy and has anemia of mother in pregnancy, antepartum; Ovarian cyst in pregnancy; previous cesarean delivery, antepartum; Language barrier affecting health care; H/O gestational diabetes in prior pregnancy, currently pregnant; and Advanced maternal age in multigravida on her problem list.  Patient reports headache, has not tried any medication yet.  Also reports pain in epigastric area, +nausea.  Uncertain regarding what makes pain worsen.  .  Contractions: Not present. Vag. Bleeding: None.   . Denies leaking of fluid.   The following portions of the patient's history were reviewed and updated as appropriate: allergies, current medications, past family history, past medical history, past social history, past surgical history and problem list. Problem list updated.  Objective:   Filed Vitals:   09/11/15 0911  BP: 121/74  Pulse: 83  Temp: 98.6 F (37 C)  Weight: 178 lb 9.6 oz (81.012 kg)    Fetal Status: Fetal Heart Rate (bpm): 154 Fundal Height: 17 cm       General:  Alert, oriented and cooperative. Patient is in no acute distress.  Skin: Skin is warm and dry. No rash noted.   Cardiovascular: Normal heart rate noted  Respiratory: Normal respiratory effort, no problems with respiration noted  Abdomen: Soft, gravid, appropriate for gestational age. Pain/Pressure: Present     Pelvic: Vag. Bleeding: None     Cervical exam deferred        Extremities: Normal range of motion.  Edema: None  Mental Status: Normal mood and affect. Normal behavior. Normal judgment and thought content.   Urinalysis: Urine Protein: 1+ Urine Glucose: Negative  Assessment and Plan:  Pregnancy: G3P2002 at 121w1d1. Advanced maternal age in multigravida, second trimester - Reviewed results of panorama and NT  2. Supervision of high  risk pregnancy, antepartum, second trimester  3. Headache in Pregnancy - Tylenol otc  4. Epigastric pain - Comp Met (CMET) - CBC - Amylase - Lipase  General obstetric precautions including but not limited to vaginal bleeding and pelvic pain reviewed in detail with the patient.  Return in about 4 weeks (around 10/09/2015).   WaVenia Carbon Michiel CowboyCNM

## 2015-09-27 ENCOUNTER — Ambulatory Visit (HOSPITAL_COMMUNITY): Payer: Medicaid Other

## 2015-09-29 NOTE — L&D Delivery Note (Signed)
Delivery Note   Requested by Dr. Shawnie PonsPratt to attend this repeat C-section delivery at 39 and 5/[redacted] weeks GA due to prior C section deliveries .   [facility] a 3P2, GBS [facility]  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  ROM occurred at delivery with clear fluid.   Infant vigorous with good spontaneous cry.  Routine NRP followed including warming, drying and stimulation.  Apgars 9 / 9.  Physical exam within normal limits.   Left in OR for skin-to-skin contact with mother, in care of CN staff.  Care transferred to Pediatrician.  Electronically signed by: Bonner PunaFairy A. Effie Shyoleman, NNP-BC

## 2015-10-04 ENCOUNTER — Encounter (HOSPITAL_COMMUNITY): Payer: Self-pay

## 2015-10-04 ENCOUNTER — Ambulatory Visit (HOSPITAL_COMMUNITY)
Admission: RE | Admit: 2015-10-04 | Discharge: 2015-10-04 | Disposition: A | Discharge status: Home / Self Care | Payer: Medicaid Other | Source: Ambulatory Visit | Attending: Physician Assistant | Admitting: Physician Assistant

## 2015-10-04 DIAGNOSIS — O09292 Supervision of pregnancy with other poor reproductive or obstetric history, second trimester: Secondary | ICD-10-CM | POA: Diagnosis present

## 2015-10-04 DIAGNOSIS — Z36 Encounter for antenatal screening of mother: Secondary | ICD-10-CM | POA: Diagnosis not present

## 2015-10-04 DIAGNOSIS — Z3A19 19 weeks gestation of pregnancy: Secondary | ICD-10-CM | POA: Insufficient documentation

## 2015-10-04 DIAGNOSIS — O09522 Supervision of elderly multigravida, second trimester: Secondary | ICD-10-CM | POA: Diagnosis not present

## 2015-10-04 DIAGNOSIS — O34219 Maternal care for unspecified type scar from previous cesarean delivery: Secondary | ICD-10-CM | POA: Diagnosis not present

## 2015-10-09 ENCOUNTER — Ambulatory Visit (INDEPENDENT_AMBULATORY_CARE_PROVIDER_SITE_OTHER): Payer: Medicaid Other | Admitting: Certified Nurse Midwife

## 2015-10-09 VITALS — BP 113/69 | HR 81 | Wt 182.3 lb

## 2015-10-09 DIAGNOSIS — O09292 Supervision of pregnancy with other poor reproductive or obstetric history, second trimester: Secondary | ICD-10-CM

## 2015-10-09 DIAGNOSIS — O34219 Maternal care for unspecified type scar from previous cesarean delivery: Secondary | ICD-10-CM

## 2015-10-09 DIAGNOSIS — D649 Anemia, unspecified: Secondary | ICD-10-CM

## 2015-10-09 DIAGNOSIS — O99012 Anemia complicating pregnancy, second trimester: Secondary | ICD-10-CM

## 2015-10-09 DIAGNOSIS — Z789 Other specified health status: Secondary | ICD-10-CM

## 2015-10-09 DIAGNOSIS — O09522 Supervision of elderly multigravida, second trimester: Secondary | ICD-10-CM

## 2015-10-09 DIAGNOSIS — O0992 Supervision of high risk pregnancy, unspecified, second trimester: Secondary | ICD-10-CM

## 2015-10-09 DIAGNOSIS — Z8632 Personal history of gestational diabetes: Principal | ICD-10-CM

## 2015-10-09 LAB — POCT URINALYSIS DIP (DEVICE)
Bilirubin Urine: NEGATIVE
Glucose, UA: NEGATIVE mg/dL
Ketones, ur: NEGATIVE mg/dL
Nitrite: NEGATIVE
Protein, ur: 30 mg/dL — AB
Specific Gravity, Urine: >=1.03 (ref 1.005–1.030)
Urobilinogen, UA: 0.2 mg/dL (ref 0.0–1.0)
pH: 6 (ref 5.0–8.0)

## 2015-10-09 NOTE — Addendum Note (Signed)
Addended by: Sherre LainASH, Legrand Lasser A on: 10/09/2015 10:19 AM   Modules accepted: Orders

## 2015-10-09 NOTE — Patient Instructions (Signed)
Iron Deficiency Anemia, Adult Anemia is when you have a low number of healthy red blood cells. It is often caused by too little iron. This is called iron deficiency anemia. It may make you tired and short of breath. HOME CARE   Take iron as told by your doctor.  Take vitamins as told by your doctor.  Eat foods that have iron in them. This includes liver, lean beef, whole-grain bread, eggs, dried fruit, and dark green leafy vegetables. GET HELP RIGHT AWAY IF:  You pass out (faint).  You have chest pain.  You feel sick to your stomach (nauseous) or throw up (vomit).  You get very short of breath with activity.  You are weak.  You have a fast heartbeat.  You start to sweat for no reason.  You become light-headed when getting up from a chair or bed. MAKE SURE YOU:  Understand these instructions.  Will watch your condition.  Will get help right away if you are not doing well or get worse.   This information is not intended to replace advice given to you by your health care provider. Make sure you discuss any questions you have with your health care provider.   Document Released: 10/17/2010 Document Revised: 10/05/2014 Document Reviewed: 05/22/2013 Elsevier Interactive Patient Education 2016 Elsevier Inc.  

## 2015-10-09 NOTE — Progress Notes (Signed)
Used Interpreter Murphy OilFeryal Yousef. C/o dizziness sometimes like she is going to pass out.

## 2015-10-09 NOTE — Progress Notes (Signed)
Subjective:  Maurice SmallMina Cornick is a 40 y.o. G3P2002 at 707w1d being seen today for ongoing prenatal care.  She is currently monitored for the following issues for this high-risk pregnancy and has Hypokalemia; Abnormal finding on EKG; Hypoadrenalism (HCC); Anemia of mother in pregnancy, antepartum; Ovarian cyst in pregnancy; Supervision of high risk pregnancy, antepartum; Previous cesarean delivery, antepartum; Language barrier affecting health care; H/O gestational diabetes in prior pregnancy, currently pregnant; and Advanced maternal age in multigravida on her problem list.  Patient reports feeling dizzy at times. Will Check CBC today and follow up appropriately.  Contractions: Not present. Vag. Bleeding: None.  Movement: Present. Denies leaking of fluid.   The following portions of the patient's history were reviewed and updated as appropriate: allergies, current medications, past family history, past medical history, past social history, past surgical history and problem list. Problem list updated.  Objective:   Filed Vitals:   10/09/15 0949  BP: 113/69  Pulse: 81  Weight: 182 lb 4.8 oz (82.691 kg)    Fetal Status: Fetal Heart Rate (bpm): 150 Fundal Height: 20 cm Movement: Present     General:  Alert, oriented and cooperative. Patient is in no acute distress.  Skin: Skin is warm and dry. No rash noted.   Cardiovascular: Normal heart rate noted  Respiratory: Normal respiratory effort, no problems with respiration noted  Abdomen: Soft, gravid, appropriate for gestational age. Pain/Pressure: Absent     Pelvic: Vag. Bleeding: None     Cervical exam deferred        Extremities: Normal range of motion.  Edema: None  Mental Status: Normal mood and affect. Normal behavior. Normal judgment and thought content.   Urinalysis: Urine Protein: 1+ Urine Glucose: Negative  Assessment and Plan:  Pregnancy: G3P2002 at 377w1d  1. H/O gestational diabetes in prior pregnancy, currently pregnant, second  trimester   2. Language barrier affecting health care Interpreter present 3. Advanced maternal age in multigravida, second trimester  4. Previous cesarean delivery, antepartum  5. Supervision of high risk pregnancy, antepartum, second trimester   6. Anemia of mother in pregnancy, antepartum, second trimester CBC  Preterm labor symptoms and general obstetric precautions including but not limited to vaginal bleeding, contractions, leaking of fluid and fetal movement were reviewed in detail with the patient. Please refer to After Visit Summary for other counseling recommendations.  Return in about 4 weeks (around 11/06/2015).   Rhea PinkLori A Clemmons, CNM

## 2015-10-10 LAB — CBC
HCT: 33.3 % — ABNORMAL LOW (ref 36.0–46.0)
Hemoglobin: 11.2 g/dL — ABNORMAL LOW (ref 12.0–15.0)
MCH: 28.6 pg (ref 26.0–34.0)
MCHC: 33.6 g/dL (ref 30.0–36.0)
MCV: 85.2 fL (ref 78.0–100.0)
Platelets: 101 10*3/uL — ABNORMAL LOW (ref 150–400)
RBC: 3.91 MIL/uL (ref 3.87–5.11)
RDW: 14.8 % (ref 11.5–15.5)
WBC: 6.5 10*3/uL (ref 4.0–10.5)

## 2015-11-06 ENCOUNTER — Encounter: Payer: Medicaid Other | Admitting: Family

## 2015-11-13 ENCOUNTER — Ambulatory Visit (INDEPENDENT_AMBULATORY_CARE_PROVIDER_SITE_OTHER): Payer: Medicaid Other | Admitting: Advanced Practice Midwife

## 2015-11-13 VITALS — BP 109/69 | HR 89 | Temp 98.1°F | Wt 189.7 lb

## 2015-11-13 DIAGNOSIS — O0992 Supervision of high risk pregnancy, unspecified, second trimester: Secondary | ICD-10-CM

## 2015-11-13 DIAGNOSIS — O09522 Supervision of elderly multigravida, second trimester: Secondary | ICD-10-CM | POA: Diagnosis not present

## 2015-11-13 DIAGNOSIS — O09292 Supervision of pregnancy with other poor reproductive or obstetric history, second trimester: Secondary | ICD-10-CM

## 2015-11-13 DIAGNOSIS — Z8632 Personal history of gestational diabetes: Secondary | ICD-10-CM

## 2015-11-13 DIAGNOSIS — O0993 Supervision of high risk pregnancy, unspecified, third trimester: Secondary | ICD-10-CM

## 2015-11-13 LAB — POCT URINALYSIS DIP (DEVICE)
Bilirubin Urine: NEGATIVE
GLUCOSE, UA: NEGATIVE mg/dL
Hgb urine dipstick: NEGATIVE
KETONES UR: NEGATIVE mg/dL
Leukocytes, UA: NEGATIVE
Nitrite: NEGATIVE
PROTEIN: NEGATIVE mg/dL
Specific Gravity, Urine: <=1.005 (ref 1.005–1.030)
UROBILINOGEN UA: 0.2 mg/dL (ref 0.0–1.0)
pH: 6 (ref 5.0–8.0)

## 2015-11-13 NOTE — Progress Notes (Signed)
Interpreter Azucena Cecil present for encounter  Subjective:  Lindsey Molina is a 40 y.o. G3P2002 at [redacted]w[redacted]d being seen today for ongoing prenatal care.  She is currently monitored for the following issues for this high-risk pregnancy and has Hypokalemia; Abnormal finding on EKG; Hypoadrenalism (HCC); Anemia of mother in pregnancy, antepartum; Ovarian cyst in pregnancy; Supervision of high risk pregnancy, antepartum; Previous cesarean delivery, antepartum; Language barrier affecting health care; H/O gestational diabetes in prior pregnancy, currently pregnant; and Advanced maternal age in multigravida on her problem list.  Patient reports no complaints.  Contractions: Not present. Vag. Bleeding: None.  Movement: Present. Denies leaking of fluid.   The following portions of the patient's history were reviewed and updated as appropriate: allergies, current medications, past family history, past medical history, past social history, past surgical history and problem list. Problem list updated.  Objective:   Filed Vitals:   11/13/15 1031  BP: 109/69  Pulse: 89  Temp: 98.1 F (36.7 C)  Weight: 189 lb 11.2 oz (86.047 kg)    Fetal Status: Fetal Heart Rate (bpm): 158   Movement: Present     General:  Alert, oriented and cooperative. Patient is in no acute distress.  Skin: Skin is warm and dry. No rash noted.   Cardiovascular: Normal heart rate noted  Respiratory: Normal respiratory effort, no problems with respiration noted  Abdomen: Soft, gravid, appropriate for gestational age. Pain/Pressure: Absent     Pelvic: Vag. Bleeding: None     Cervical exam deferred        Extremities: Normal range of motion.  Edema: None  Mental Status: Normal mood and affect. Normal behavior. Normal judgment and thought content.   Urinalysis: Urine Protein: Negative Urine Glucose: Negative  Assessment and Plan:  Pregnancy: G3P2002 at [redacted]w[redacted]d  1. Supervision of high-risk pregnancy, third trimester  - [place] [facility]  FOLLOW UP; Future  2. H/O gestational diabetes in prior pregnancy, currently pregnant, second trimester --Failed early 1 hour, pt notified by telephone but reports she was not aware.  Now pt 25 weeks. Will not repeat 1 hour, scheduled 3 hour GTT in 2 weeks with routine visit.  3. Advanced maternal age in multigravida, second trimester --Anatomy US and NIPS wnl  Preterm labor symptoms and general obstetric precautions including but not limited to vaginal bleeding, contractions, leaking of fluid and fetal movement were reviewed in detail with the patient. Please refer to After Visit Summary for other counseling recommendations.  Return in about 2 weeks (around 11/27/2015) for LOB and 3 hour GTT.   Hurshel Party, CNM

## 2015-11-13 NOTE — Patient Instructions (Signed)
Glucose Tolerance Test During Pregnancy The glucose tolerance test (GTT) is a blood test used to determine if you have developed a type of diabetes during pregnancy (gestational diabetes). This is when your body does not properly process sugar (glucose) in the food you eat, resulting in high blood glucose levels. Typically, a GTT is done after you have had a 1-hour glucose test with results that indicate you possibly have gestational diabetes. It may also be done if:  You have a history of giving birth to very large babies or have experienced repeated fetal loss (stillbirth).   You have signs and symptoms of diabetes, such as:   Changes in your vision.   Tingling or numbness in your hands or feet.   Changes in hunger, thirst, and urination not otherwise explained by your pregnancy.  The GTT lasts about 3 hours. You will be given a sugar-water solution to drink at the beginning of the test. You will have blood drawn before you drink the solution and then again 1, 2, and 3 hours after you drink it. You will not be allowed to eat or drink anything else during the test. You must remain at the testing location to make sure that your blood is drawn on time. You should also avoid exercising during the test, because exercise can alter test results. PREPARATION FOR TEST  Eat normally for 3 days prior to the GTT test, including having plenty of carbohydrate-rich foods. Do not eat or drink anything except water during the final 12 hours before the test. In addition, your health care provider may ask you to stop taking certain medicines before the test. RESULTS  It is your responsibility to obtain your test results. Ask the lab or department performing the test when and how you will get your results. Contact your health care provider to discuss any questions you have about your results.  Range of Normal Values Ranges for normal values may vary among different labs and hospitals. You should always check  with your health care provider after having lab work or other tests done to discuss whether your values are considered within normal limits. Normal levels of blood glucose are as follows:  Fasting: less than 105 mg/dL.   1 hour after drinking the solution: less than 190 mg/dL.   2 hours after drinking the solution: less than 165 mg/dL.   3 hours after drinking the solution: less than 145 mg/dL.  Some substances can interfere with GTT results. These may include:  Blood pressure and heart failure medicines, including beta blockers, furosemide, and thiazides.   Anti-inflammatory medicines, including aspirin.   Nicotine.   Some psychiatric medicines.  Meaning of Results Outside Normal Value Ranges GTT test results that are above normal values may indicate a number of health problems, such as:   Gestational diabetes.   Acute stress response.   Cushing syndrome.   Tumors such as pheochromocytoma or glucagonoma.   Long-term kidney problems.   Pancreatitis.   Hyperthyroidism.   Current infection.  Discuss your test results with your health care provider. He or she will use the results to make a diagnosis and determine a treatment plan that is right for you.   This information is not intended to replace advice given to you by your health care provider. Make sure you discuss any questions you have with your health care provider.   Document Released: 03/15/2012 Document Revised: 10/05/2014 Document Reviewed: 01/19/2014 Elsevier Interactive Patient Education 2016 Elsevier Inc.  

## 2015-11-13 NOTE — Progress Notes (Signed)
Interpreter Azucena Cecil present for encounter.  [place] for follow up anatomy/growth scheduled 2/27 @ 1030.

## 2015-11-25 ENCOUNTER — Ambulatory Visit (HOSPITAL_COMMUNITY)
Admission: RE | Admit: 2015-11-25 | Discharge: 2015-11-25 | Disposition: A | Discharge status: Home / Self Care | Payer: Medicaid Other | Source: Ambulatory Visit | Attending: Advanced Practice Midwife | Admitting: Advanced Practice Midwife

## 2015-11-25 ENCOUNTER — Encounter (HOSPITAL_COMMUNITY): Payer: Self-pay

## 2015-11-25 DIAGNOSIS — O09522 Supervision of elderly multigravida, second trimester: Secondary | ICD-10-CM | POA: Insufficient documentation

## 2015-11-25 DIAGNOSIS — O34219 Maternal care for unspecified type scar from previous cesarean delivery: Secondary | ICD-10-CM | POA: Diagnosis not present

## 2015-11-25 DIAGNOSIS — O09292 Supervision of pregnancy with other poor reproductive or obstetric history, second trimester: Secondary | ICD-10-CM | POA: Insufficient documentation

## 2015-11-25 DIAGNOSIS — Z36 Encounter for antenatal screening of mother: Secondary | ICD-10-CM | POA: Insufficient documentation

## 2015-11-25 DIAGNOSIS — O99810 Abnormal glucose complicating pregnancy: Secondary | ICD-10-CM | POA: Diagnosis not present

## 2015-11-25 DIAGNOSIS — O0993 Supervision of high risk pregnancy, unspecified, third trimester: Secondary | ICD-10-CM

## 2015-11-25 DIAGNOSIS — Z3A26 26 weeks gestation of pregnancy: Secondary | ICD-10-CM | POA: Diagnosis not present

## 2015-11-27 ENCOUNTER — Ambulatory Visit (INDEPENDENT_AMBULATORY_CARE_PROVIDER_SITE_OTHER): Payer: Medicaid Other | Admitting: Obstetrics and Gynecology

## 2015-11-27 VITALS — BP 115/73 | HR 87 | Temp 98.5°F | Wt 190.8 lb

## 2015-11-27 DIAGNOSIS — Z23 Encounter for immunization: Secondary | ICD-10-CM

## 2015-11-27 DIAGNOSIS — Z8632 Personal history of gestational diabetes: Secondary | ICD-10-CM

## 2015-11-27 DIAGNOSIS — D649 Anemia, unspecified: Secondary | ICD-10-CM | POA: Diagnosis not present

## 2015-11-27 DIAGNOSIS — O09292 Supervision of pregnancy with other poor reproductive or obstetric history, second trimester: Secondary | ICD-10-CM

## 2015-11-27 DIAGNOSIS — O99012 Anemia complicating pregnancy, second trimester: Secondary | ICD-10-CM

## 2015-11-27 DIAGNOSIS — O34219 Maternal care for unspecified type scar from previous cesarean delivery: Secondary | ICD-10-CM

## 2015-11-27 DIAGNOSIS — O0991 Supervision of high risk pregnancy, unspecified, first trimester: Secondary | ICD-10-CM

## 2015-11-27 LAB — CBC
HCT: 32.1 % — ABNORMAL LOW (ref 36.0–46.0)
Hemoglobin: 10.5 g/dL — ABNORMAL LOW (ref 12.0–15.0)
MCH: 27.4 pg (ref 26.0–34.0)
MCHC: 32.7 g/dL (ref 30.0–36.0)
MCV: 83.8 fL (ref 78.0–100.0)
MPV: 12.8 fL — AB (ref 8.6–12.4)
PLATELETS: 114 10*3/uL — AB (ref 150–400)
RBC: 3.83 MIL/uL — AB (ref 3.87–5.11)
RDW: 14.1 % (ref 11.5–15.5)
WBC: 5.9 10*3/uL (ref 4.0–10.5)

## 2015-11-27 LAB — POCT URINALYSIS DIP (DEVICE)
Bilirubin Urine: NEGATIVE
GLUCOSE, UA: NEGATIVE mg/dL
Hgb urine dipstick: NEGATIVE
Ketones, ur: NEGATIVE mg/dL
LEUKOCYTES UA: NEGATIVE
NITRITE: NEGATIVE
PROTEIN: 30 mg/dL — AB
Specific Gravity, Urine: 1.015 (ref 1.005–1.030)
UROBILINOGEN UA: 0.2 mg/dL (ref 0.0–1.0)
pH: 8.5 — ABNORMAL HIGH (ref 5.0–8.0)

## 2015-11-27 LAB — GLUCOSE, CAPILLARY: Glucose-Capillary: 87 mg/dL (ref 65–99)

## 2015-11-27 MED ORDER — PRENATAL VITAMINS PLUS 27-1 MG PO TABS: 1.0000 | ORAL_TABLET | Freq: Every day | ORAL | Status: DC

## 2015-11-27 MED ORDER — TETANUS-DIPHTH-ACELL PERTUSSIS 5-2.5-18.5 LF-MCG/0.5 IM SUSP
0.5000 mL | Freq: Once | INTRAMUSCULAR | Status: AC
  Administered 2015-11-27: 0.5 mL via INTRAMUSCULAR

## 2015-11-27 NOTE — Progress Notes (Signed)
Nuha used for interpreter for check in

## 2015-11-27 NOTE — Progress Notes (Signed)
Subjective:  Lindsey Molina is a 40 y.o. G3P2002 at [redacted]w[redacted]d being seen today for ongoing prenatal care.  She is currently monitored for the following issues for this low-risk pregnancy and has Hypokalemia; Abnormal finding on EKG; Hypoadrenalism (HCC); Anemia of mother in pregnancy, antepartum; Ovarian cyst in pregnancy; Supervision of high risk pregnancy, antepartum; Previous cesarean delivery, antepartum; Language barrier affecting health care; H/O gestational diabetes in prior pregnancy, currently pregnant; and Advanced maternal age in multigravida on her problem list.  Patient reports no complaints.  Contractions: Not present. Vag. Bleeding: None.  Movement: Present. Denies leaking of fluid.   The following portions of the patient's history were reviewed and updated as appropriate: allergies, current medications, past family history, past medical history, past social history, past surgical history and problem list. Problem list updated.  Objective:   Filed Vitals:   11/27/15 0839  BP: 115/73  Pulse: 87  Temp: 98.5 F (36.9 C)  Weight: 190 lb 12.8 oz (86.546 kg)    Fetal Status: Fetal Heart Rate (bpm): 147   Movement: Present     General:  Alert, oriented and cooperative. Patient is in no acute distress.  Skin: Skin is warm and dry. No rash noted.   Cardiovascular: Normal heart rate noted  Respiratory: Normal respiratory effort, no problems with respiration noted  Abdomen: Soft, gravid, appropriate for gestational age. Pain/Pressure: Absent     Pelvic: Vag. Bleeding: None     Cervical exam deferred        Extremities: Normal range of motion.  Edema: None  Mental Status: Normal mood and affect. Normal behavior. Normal judgment and thought content.   Urinalysis: Urine Protein: 1+ Urine Glucose: Negative  Assessment and Plan:  Pregnancy: G3P2002 at [redacted]w[redacted]d  1. Pregnancy - CBC - RPR - Tdap (BOOSTRIX) injection 0.5 mL; Inject 0.5 mLs into the muscle once. - HIV antibody (with  reflex) - Glucose tolerance, 3 hours - ordering f/u u/s for growth  - [facility] [place] for pap results      Preterm labor symptoms and general obstetric precautions including but not limited to vaginal bleeding, contractions, leaking of fluid and fetal movement were reviewed in detail with the patient. Please refer to After Visit Summary for other counseling recommendations.   Kathrynn Running, MD

## 2015-11-27 NOTE — Progress Notes (Addendum)
Betsy, Research scientist (medical), informed me that pt is here today for a 3hr and she was presenting with diaphoresis, tremor, and feeling faint.  Betsy gave pt cold wet washcloth.  BS checked; CBG 87.  BP 92/74 manually.  Reported to [facility], CNM and no new orders just continue to monitor since pt is within her last hour of the 3 hr testing. Pt is currently fine and smiling.

## 2015-11-28 LAB — GLUCOSE TOLERANCE, 3 HOURS
GLUCOSE, 1 HOUR-GESTATIONAL: 150 mg/dL (ref <190)
GLUCOSE, FASTING-GESTATIONAL: 75 mg/dL (ref 65–104)
Glucose Tolerance, 2 hour: 131 mg/dL (ref <165)
Glucose, GTT - 3 Hour: 108 mg/dL (ref <145)

## 2015-11-28 LAB — RPR

## 2015-11-28 LAB — HIV ANTIBODY (ROUTINE TESTING W REFLEX): HIV 1&2 Ab, 4th Generation: NONREACTIVE

## 2015-12-11 ENCOUNTER — Ambulatory Visit (INDEPENDENT_AMBULATORY_CARE_PROVIDER_SITE_OTHER): Payer: Medicaid Other | Admitting: Advanced Practice Midwife

## 2015-12-11 VITALS — BP 112/66 | HR 89 | Temp 98.2°F | Wt 193.0 lb

## 2015-12-11 DIAGNOSIS — D649 Anemia, unspecified: Secondary | ICD-10-CM | POA: Diagnosis not present

## 2015-12-11 DIAGNOSIS — O0991 Supervision of high risk pregnancy, unspecified, first trimester: Secondary | ICD-10-CM

## 2015-12-11 DIAGNOSIS — O09523 Supervision of elderly multigravida, third trimester: Secondary | ICD-10-CM | POA: Diagnosis not present

## 2015-12-11 DIAGNOSIS — Z8639 Personal history of other endocrine, nutritional and metabolic disease: Secondary | ICD-10-CM | POA: Diagnosis not present

## 2015-12-11 DIAGNOSIS — O99013 Anemia complicating pregnancy, third trimester: Secondary | ICD-10-CM

## 2015-12-11 LAB — POCT URINALYSIS DIP (DEVICE)
BILIRUBIN URINE: NEGATIVE
GLUCOSE, UA: NEGATIVE mg/dL
HGB URINE DIPSTICK: NEGATIVE
Ketones, ur: NEGATIVE mg/dL
LEUKOCYTES UA: NEGATIVE
NITRITE: NEGATIVE
Protein, ur: NEGATIVE mg/dL
Specific Gravity, Urine: 1.015 (ref 1.005–1.030)
Urobilinogen, UA: 0.2 mg/dL (ref 0.0–1.0)
pH: 5.5 (ref 5.0–8.0)

## 2015-12-11 NOTE — Progress Notes (Signed)
Feryel used for interpreter  Reviewed tip of week with patient

## 2015-12-11 NOTE — Progress Notes (Signed)
Subjective:  Lindsey Molina is a 40 y.o. G3P2002 at 4366w1d being seen today for ongoing prenatal care.  She is currently monitored for the following issues for this high-risk pregnancy and has Hypoadrenalism (HCC); Anemia of mother in pregnancy, antepartum; Ovarian cyst in pregnancy; Supervision of high risk pregnancy, antepartum; Previous cesarean delivery, antepartum; Language barrier affecting health care; H/O gestational diabetes in prior pregnancy, currently pregnant; and Advanced maternal age in multigravida on her problem list.  Patient reports no complaints.  Contractions: Not present. Vag. Bleeding: None.  Movement: Present. Denies leaking of fluid.   The following portions of the patient's history were reviewed and updated as appropriate: allergies, current medications, past family history, past medical history, past social history, past surgical history and problem list. Problem list updated.  Objective:   Filed Vitals:   12/11/15 1031  BP: 112/66  Pulse: 89  Temp: 98.2 F (36.8 C)  Weight: 193 lb (87.544 kg)    Fetal Status: Fetal Heart Rate (bpm): 153 Fundal Height: 30 cm Movement: Present     General:  Alert, oriented and cooperative. Patient is in no acute distress.  Skin: Skin is warm and dry. No rash noted.   Cardiovascular: Normal heart rate noted  Respiratory: Normal respiratory effort, no problems with respiration noted  Abdomen: Soft, gravid, appropriate for gestational age. Pain/Pressure: Absent     Pelvic: Vag. Bleeding: None     Cervical exam deferred        Extremities: Normal range of motion.  Edema: None  Mental Status: Normal mood and affect. Normal behavior. Normal judgment and thought content.   Urinalysis: Urine Protein: Negative Urine Glucose: Negative  Assessment and Plan:  Pregnancy: G3P2002 at 2666w1d  1. Supervision of high risk pregnancy, antepartum, first trimester --Discussed US and testing for AMA with pt.  Pt states understanding.  US ordered  for 12/24/15.  2. History of hypoadrenalism --Pt sees endocrinologist and is taking daily hydrocortisone 10 mg.  Recommend pt continue to see endocrinology during and after pregnancy.   Preterm labor symptoms and general obstetric precautions including but not limited to vaginal bleeding, contractions, leaking of fluid and fetal movement were reviewed in detail with the patient. Please refer to After Visit Summary for other counseling recommendations.  Return in about 2 weeks (around 12/25/2015).   Hurshel PartyLisa A Leftwich-Kirby, CNM

## 2015-12-11 NOTE — Patient Instructions (Addendum)
You have ultrasound scheduled on Tuesday, 12/24/15.    Third Trimester of Pregnancy The third trimester is from week 29 through week 42, months 7 through 9. The third trimester is a time when the fetus is growing rapidly. At the end of the ninth month, the fetus is about 20 inches in length and weighs 6-10 pounds.  BODY CHANGES Your body goes through many changes during pregnancy. The changes vary from woman to woman.   Your weight will continue to increase. You can expect to gain 25-35 pounds (11-16 kg) by the end of the pregnancy.  You may begin to get stretch marks on your hips, abdomen, and breasts.  You may urinate more often because the fetus is moving lower into your pelvis and pressing on your bladder.  You may develop or continue to have heartburn as a result of your pregnancy.  You may develop constipation because certain hormones are causing the muscles that push waste through your intestines to slow down.  You may develop hemorrhoids or swollen, bulging veins (varicose veins).  You may have pelvic pain because of the weight gain and pregnancy hormones relaxing your joints between the bones in your pelvis. Backaches may result from overexertion of the muscles supporting your posture.  You may have changes in your hair. These can include thickening of your hair, rapid growth, and changes in texture. Some women also have hair loss during or after pregnancy, or hair that feels dry or thin. Your hair will most likely return to normal after your baby is born.  Your breasts will continue to grow and be tender. A yellow discharge may leak from your breasts called colostrum.  Your belly button may stick out.  You may feel short of breath because of your expanding uterus.  You may notice the fetus "dropping," or moving lower in your abdomen.  You may have a bloody mucus discharge. This usually occurs a few days to a week before labor begins.  Your cervix becomes thin and soft  (effaced) near your due date. WHAT TO EXPECT AT YOUR PRENATAL EXAMS  You will have prenatal exams every 2 weeks until week 36. Then, you will have weekly prenatal exams. During a routine prenatal visit:  You will be weighed to make sure you and the fetus are growing normally.  Your blood pressure is taken.  Your abdomen will be measured to track your baby's growth.  The fetal heartbeat will be listened to.  Any test results from the previous visit will be discussed.  You may have a cervical check near your due date to see if you have effaced. At around 36 weeks, your caregiver will check your cervix. At the same time, your caregiver will also perform a test on the secretions of the vaginal tissue. This test is to determine if a type of bacteria, Group B streptococcus, is present. Your caregiver will explain this further. Your caregiver may ask you:  What your birth plan is.  How you are feeling.  If you are feeling the baby move.  If you have had any abnormal symptoms, such as leaking fluid, bleeding, severe headaches, or abdominal cramping.  If you are using any tobacco products, including cigarettes, chewing tobacco, and electronic cigarettes.  If you have any questions. Other tests or screenings that may be performed during your third trimester include:  Blood tests that check for low iron levels (anemia).  Fetal testing to check the health, activity level, and growth of the fetus. Testing  is done if you have certain medical conditions or if there are problems during the pregnancy.  HIV (human immunodeficiency virus) testing. If you are at high risk, you may be screened for HIV during your third trimester of pregnancy. FALSE LABOR You may feel small, irregular contractions that eventually go away. These are called Braxton Hicks contractions, or false labor. Contractions may last for hours, days, or even weeks before true labor sets in. If contractions come at regular  intervals, intensify, or become painful, it is best to be seen by your caregiver.  SIGNS OF LABOR   Menstrual-like cramps.  Contractions that are 5 minutes apart or less.  Contractions that start on the top of the uterus and spread down to the lower abdomen and back.  A sense of increased pelvic pressure or back pain.  A watery or bloody mucus discharge that comes from the vagina. If you have any of these signs before the 37th week of pregnancy, call your caregiver right away. You need to go to the hospital to get checked immediately. HOME CARE INSTRUCTIONS   Avoid all smoking, herbs, alcohol, and unprescribed drugs. These chemicals affect the formation and growth of the baby.  Do not use any tobacco products, including cigarettes, chewing tobacco, and electronic cigarettes. If you need help quitting, ask your health care provider. You may receive counseling support and other resources to help you quit.  Follow your caregiver's instructions regarding medicine use. There are medicines that are either safe or unsafe to take during pregnancy.  Exercise only as directed by your caregiver. Experiencing uterine cramps is a good sign to stop exercising.  Continue to eat regular, healthy meals.  Wear a good support bra for breast tenderness.  Do not use hot tubs, steam rooms, or saunas.  Wear your seat belt at all times when driving.  Avoid raw meat, uncooked cheese, cat litter boxes, and soil used by cats. These carry germs that can cause birth defects in the baby.  Take your prenatal vitamins.  Take 1500-2000 mg of calcium daily starting at the 20th week of pregnancy until you deliver your baby.  Try taking a stool softener (if your caregiver approves) if you develop constipation. Eat more high-fiber foods, such as fresh vegetables or fruit and whole grains. Drink plenty of fluids to keep your urine clear or pale yellow.  Take warm sitz baths to soothe any pain or discomfort caused  by hemorrhoids. Use hemorrhoid cream if your caregiver approves.  If you develop varicose veins, wear support hose. Elevate your feet for 15 minutes, 3-4 times a day. Limit salt in your diet.  Avoid heavy lifting, wear low heal shoes, and practice good posture.  Rest a lot with your legs elevated if you have leg cramps or low back pain.  Visit your dentist if you have not gone during your pregnancy. Use a soft toothbrush to brush your teeth and be gentle when you floss.  A sexual relationship may be continued unless your caregiver directs you otherwise.  Do not travel far distances unless it is absolutely necessary and only with the approval of your caregiver.  Take prenatal classes to understand, practice, and ask questions about the labor and delivery.  Make a trial run to the hospital.  Pack your hospital bag.  Prepare the baby's nursery.  Continue to go to all your prenatal visits as directed by your caregiver. SEEK MEDICAL CARE IF:  You are unsure if you are in labor or if your  water has broken.  You have dizziness.  You have mild pelvic cramps, pelvic pressure, or nagging pain in your abdominal area.  You have persistent nausea, vomiting, or diarrhea.  You have a bad smelling vaginal discharge.  You have pain with urination. SEEK IMMEDIATE MEDICAL CARE IF:   You have a fever.  You are leaking fluid from your vagina.  You have spotting or bleeding from your vagina.  You have severe abdominal cramping or pain.  You have rapid weight loss or gain.  You have shortness of breath with chest pain.  You notice sudden or extreme swelling of your face, hands, ankles, feet, or legs.  You have not felt your baby move in over an hour.  You have severe headaches that do not go away with medicine.  You have vision changes.   This information is not intended to replace advice given to you by your health care provider. Make sure you discuss any questions you have with  your health care provider.   Document Released: 09/08/2001 Document Revised: 10/05/2014 Document Reviewed: 11/15/2012 Elsevier Interactive Patient Education [employer]2016 Elsevier Inc.

## 2015-12-20 ENCOUNTER — Encounter (HOSPITAL_COMMUNITY): Payer: Self-pay | Admitting: *Deleted

## 2015-12-20 ENCOUNTER — Inpatient Hospital Stay (HOSPITAL_COMMUNITY)
Admission: AD | Admit: 2015-12-20 | Discharge: 2015-12-20 | Disposition: A | Discharge status: Home / Self Care | Payer: Medicaid Other | Source: Ambulatory Visit | Attending: Obstetrics & Gynecology | Admitting: Obstetrics & Gynecology

## 2015-12-20 DIAGNOSIS — R55 Syncope and collapse: Secondary | ICD-10-CM | POA: Diagnosis not present

## 2015-12-20 DIAGNOSIS — O34219 Maternal care for unspecified type scar from previous cesarean delivery: Secondary | ICD-10-CM

## 2015-12-20 DIAGNOSIS — R51 Headache: Secondary | ICD-10-CM | POA: Insufficient documentation

## 2015-12-20 DIAGNOSIS — R42 Dizziness and giddiness: Secondary | ICD-10-CM | POA: Diagnosis present

## 2015-12-20 DIAGNOSIS — Z3A30 30 weeks gestation of pregnancy: Secondary | ICD-10-CM | POA: Insufficient documentation

## 2015-12-20 DIAGNOSIS — O99012 Anemia complicating pregnancy, second trimester: Secondary | ICD-10-CM

## 2015-12-20 DIAGNOSIS — O26893 Other specified pregnancy related conditions, third trimester: Secondary | ICD-10-CM | POA: Diagnosis not present

## 2015-12-20 DIAGNOSIS — O0992 Supervision of high risk pregnancy, unspecified, second trimester: Secondary | ICD-10-CM

## 2015-12-20 DIAGNOSIS — O3482 Maternal care for other abnormalities of pelvic organs, second trimester: Secondary | ICD-10-CM

## 2015-12-20 DIAGNOSIS — N83209 Unspecified ovarian cyst, unspecified side: Secondary | ICD-10-CM

## 2015-12-20 LAB — URINALYSIS, ROUTINE W REFLEX MICROSCOPIC
BILIRUBIN URINE: NEGATIVE
Glucose, UA: NEGATIVE mg/dL
Hgb urine dipstick: NEGATIVE
Ketones, ur: NEGATIVE mg/dL
NITRITE: NEGATIVE
PH: 5.5 (ref 5.0–8.0)
Protein, ur: NEGATIVE mg/dL
SPECIFIC GRAVITY, URINE: 1.02 (ref 1.005–1.030)

## 2015-12-20 LAB — URINE MICROSCOPIC-ADD ON

## 2015-12-20 LAB — COMPREHENSIVE METABOLIC PANEL
ALT: 13 U/L — ABNORMAL LOW (ref 14–54)
ANION GAP: 7 (ref 5–15)
AST: 17 U/L (ref 15–41)
Albumin: 2.8 g/dL — ABNORMAL LOW (ref 3.5–5.0)
Alkaline Phosphatase: 54 U/L (ref 38–126)
BILIRUBIN TOTAL: 0.4 mg/dL (ref 0.3–1.2)
BUN: 6 mg/dL (ref 6–20)
CO2: 24 mmol/L (ref 22–32)
Calcium: 8.3 mg/dL — ABNORMAL LOW (ref 8.9–10.3)
Chloride: 108 mmol/L (ref 101–111)
Creatinine, Ser: 0.33 mg/dL — ABNORMAL LOW (ref 0.44–1.00)
GFR calc Af Amer: >60 mL/min (ref >60)
Glucose, Bld: 107 mg/dL — ABNORMAL HIGH (ref 65–99)
POTASSIUM: 3.5 mmol/L (ref 3.5–5.1)
Sodium: 139 mmol/L (ref 135–145)
TOTAL PROTEIN: 6.5 g/dL (ref 6.5–8.1)

## 2015-12-20 LAB — CBC
HEMATOCRIT: 33.5 % — AB (ref 36.0–46.0)
Hemoglobin: 11.2 g/dL — ABNORMAL LOW (ref 12.0–15.0)
MCH: 27.7 pg (ref 26.0–34.0)
MCHC: 33.4 g/dL (ref 30.0–36.0)
MCV: 82.9 fL (ref 78.0–100.0)
Platelets: 108 10*3/uL — ABNORMAL LOW (ref 150–400)
RBC: 4.04 MIL/uL (ref 3.87–5.11)
RDW: 15.1 % (ref 11.5–15.5)
WBC: 6.8 10*3/uL (ref 4.0–10.5)

## 2015-12-20 MED ORDER — ACETAMINOPHEN 325 MG PO TABS
650.0000 mg | ORAL_TABLET | Freq: Once | ORAL | Status: AC
  Administered 2015-12-20: 650 mg via ORAL
  Filled 2015-12-20: qty 2

## 2015-12-20 NOTE — MAU Note (Signed)
Onset of dizziness 3 hours ago, denies pain, positive FM, denies vaginal bleeding, history of anemia has had blood transfusions in the past.

## 2015-12-20 NOTE — MAU Provider Note (Signed)
History     CSN: [id number]  Arrival date and time: 12/20/15 1441   First Provider Initiated Contact with Patient 12/20/15 1523      Chief Complaint  Patient presents with  . Dizziness   HPI  Pt is 40 yo [redacted]w[redacted]d G3P2002 who presents with c/o of dizziness several hours ago.  Pt also has headache- no vision changes or photophobia.  Pt has not taken anything for the pain.  Pt ate breakfast but not lunch.  Pt denies abdominal pain, vaginal bleeding or LOF. Pt reports + FM. Pt has hx of anemia with blood transfusion RN note: Onset of dizziness 3 hours ago, denies pain, positive FM, denies vaginal bleeding, history of anemia has had blood transfusions in the past.         Expand All Collapse All   C/o being dizzy today; hx of anemia; hx of needing blood transfusion in the past;          Past Medical History  Diagnosis Date  . Syncope and collapse 2011; 05/24/2012  . Hypotension 05/25/2012  . Dizziness 05/25/2012    "severe; and I pass out"  . Shortness of breath 05/25/2012    "occurs when I pass out"  . Respiratory arrest (HCC) 05/25/2012    "occurs when I black out; I can't breath"  . Numbness and tingling 05/25/2012    "and pain right arm when I get mad,  nervous and/or blackout; sometimes; not always"  . 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 Cesarean section  2005; 2011    Family History  Problem Relation Age of Onset  . Alcohol abuse Neg Hx   . Arthritis Neg Hx   . Asthma Neg Hx   . Birth defects Neg Hx   . Cancer Neg Hx   . COPD Neg Hx   . Depression Neg Hx   . Diabetes Neg Hx   . Drug abuse Neg Hx   . Early death Neg Hx   . Hearing loss Neg Hx   . Heart disease Neg Hx   . Hyperlipidemia Neg Hx   . Hypertension Neg Hx   . Kidney disease Neg Hx   . Learning disabilities Neg Hx   . Mental illness Neg Hx   . Mental retardation Neg Hx   . Miscarriages / Stillbirths Neg Hx   . Stroke Neg Hx   . Vision loss Neg Hx    . Varicose Veins Neg Hx     Social History  Substance Use Topics  . Smoking status: Never Smoker   . Smokeless tobacco: Never Used  . Alcohol Use: No    Allergies: No Known Allergies  Prescriptions prior to admission  Medication Sig Dispense Refill Last Dose  . folic acid (FOLVITE) 1 MG tablet Take 1 mg by mouth daily.   12/20/2015 at Unknown time  . hydrocortisone (CORTEF) 10 MG tablet Take 10 mg by mouth daily.   12/20/2015 at Unknown time  . Prenatal Vit-Fe Fumarate-FA (PRENATAL VITAMINS PLUS) 27-1 MG TABS Take 1 tablet by mouth daily. 30 tablet 11 12/20/2015 at Unknown time    Review of Systems  Constitutional: Negative for fever and chills.  Eyes: Negative for double vision and photophobia.  Gastrointestinal: Negative for abdominal pain.  Neurological: Positive for dizziness, weakness and headaches.   Physical Exam   Blood pressure 120/70, pulse 89, temperature 98 F (36.7 C), temperature source Oral, resp. rate 20, SpO2 100 %.  Physical Exam  Vitals reviewed. Constitutional: She is oriented to person, place, and time. She appears well-developed and well-nourished.  HENT:  Head: Normocephalic.  Eyes: Pupils are equal, round, and reactive to light.  Neck: Normal range of motion. Neck supple.  Cardiovascular: Normal rate.   Respiratory: Effort normal.  GI: Soft. She exhibits no distension. There is no tenderness. There is no rebound and no guarding.  Musculoskeletal: Normal range of motion.  Neurological: She is alert and oriented to person, place, and time.  Skin: Skin is warm and dry.  Psychiatric: She has a normal mood and affect.    MAU Course  Procedures Tylenol given for h/a with relief Lunch given and eaten without difficulty EKG- n ormal sinus rhythm Results for orders placed or performed during the hospital encounter of 12/20/15 (from the past 24 hour(s))  Urinalysis, Routine w reflex microscopic (not [facility])     Status: Abnormal   Collection Time:  12/20/15  2:55 PM  Result Value Ref Range   Color, Urine YELLOW YELLOW   APPearance CLEAR CLEAR   Specific Gravity, Urine 1.020 1.005 - 1.030   pH 5.5 5.0 - 8.0   Glucose, UA NEGATIVE NEGATIVE mg/dL   Hgb urine dipstick NEGATIVE NEGATIVE   Bilirubin Urine NEGATIVE NEGATIVE   Ketones, ur NEGATIVE NEGATIVE mg/dL   Protein, ur NEGATIVE NEGATIVE mg/dL   Nitrite NEGATIVE NEGATIVE   Leukocytes, UA TRACE (A) NEGATIVE  Urine microscopic-add on     Status: Abnormal   Collection Time: 12/20/15  2:55 PM  Result Value Ref Range   Squamous Epithelial / LPF 6-30 (A) NONE SEEN   WBC, UA 6-30 0 - 5 WBC/hpf   RBC / HPF 0-5 0 - 5 RBC/hpf   Bacteria, UA MANY (A) NONE SEEN  CBC     Status: Abnormal   Collection Time: 12/20/15  3:34 PM  Result Value Ref Range   WBC 6.8 4.0 - 10.5 K/uL   RBC 4.04 3.87 - 5.11 MIL/uL   Hemoglobin 11.2 (L) 12.0 - 15.0 g/dL   HCT 95.633.5 (L) 21.336.0 - 08.646.0 %   MCV 82.9 78.0 - 100.0 fL   MCH 27.7 26.0 - 34.0 pg   MCHC 33.4 30.0 - 36.0 g/dL   RDW 57.815.1 46.911.5 - 62.915.5 %   Platelets 108 (L) 150 - 400 K/uL  Comprehensive metabolic panel     Status: Abnormal   Collection Time: 12/20/15  3:34 PM  Result Value Ref Range   Sodium 139 135 - 145 mmol/L   Potassium 3.5 3.5 - 5.1 mmol/L   Chloride 108 101 - 111 mmol/L   CO2 24 22 - 32 mmol/L   Glucose, Bld 107 (H) 65 - 99 mg/dL   BUN 6 6 - 20 mg/dL   Creatinine, Ser 5.280.33 (L) 0.44 - 1.00 mg/dL   Calcium 8.3 (L) 8.9 - 10.3 mg/dL   Total Protein 6.5 6.5 - 8.1 g/dL   Albumin 2.8 (L) 3.5 - 5.0 g/dL   AST 17 15 - 41 U/L   ALT 13 (L) 14 - 54 U/L   Alkaline Phosphatase 54 38 - 126 U/L   Total Bilirubin 0.4 0.3 - 1.2 mg/dL   GFR calc non Af Amer >60 >60 mL/min   GFR calc Af Amer >60 >60 mL/min   Anion gap 7 5 - 15  NST reactive; FHR 150 bpm, with 6-25 BPM, 15x15 accelerations no decel; no ctx noted Reviewed labs and EKG with pt and husband- normal findings  Emphasized small frequent meals with protein; plenty of water and slow  movements F/u for scheduled [place] and OB appointment- return for any concerns- pain, headache, increase in dizziness, LOR or bleeding   Assessment and Plan  Near syncope in pregnancy- 3rd trimester- normal labs and EKG Headache in pregnancy- relieved with tylenol Kick counts F/u for [place] and OB appointment as scheduled  Mahogany Torrance 12/20/2015, 3:25 PM

## 2015-12-20 NOTE — MAU Note (Signed)
C/o being dizzy today; hx of anemia; hx of needing blood transfusion in the past;

## 2015-12-24 ENCOUNTER — Ambulatory Visit (HOSPITAL_COMMUNITY)
Admission: RE | Admit: 2015-12-24 | Discharge: 2015-12-24 | Disposition: A | Discharge status: Home / Self Care | Payer: Medicaid Other | Source: Ambulatory Visit | Attending: Obstetrics and Gynecology | Admitting: Obstetrics and Gynecology

## 2015-12-24 ENCOUNTER — Encounter (HOSPITAL_COMMUNITY): Payer: Self-pay

## 2015-12-24 VITALS — BP 103/70 | HR 97 | Wt 196.5 lb

## 2015-12-24 DIAGNOSIS — O09293 Supervision of pregnancy with other poor reproductive or obstetric history, third trimester: Secondary | ICD-10-CM | POA: Diagnosis not present

## 2015-12-24 DIAGNOSIS — O99012 Anemia complicating pregnancy, second trimester: Secondary | ICD-10-CM

## 2015-12-24 DIAGNOSIS — O0992 Supervision of high risk pregnancy, unspecified, second trimester: Secondary | ICD-10-CM

## 2015-12-24 DIAGNOSIS — O0991 Supervision of high risk pregnancy, unspecified, first trimester: Secondary | ICD-10-CM

## 2015-12-24 DIAGNOSIS — O34219 Maternal care for unspecified type scar from previous cesarean delivery: Secondary | ICD-10-CM

## 2015-12-24 DIAGNOSIS — O09523 Supervision of elderly multigravida, third trimester: Secondary | ICD-10-CM | POA: Diagnosis not present

## 2015-12-24 DIAGNOSIS — O09529 Supervision of elderly multigravida, unspecified trimester: Secondary | ICD-10-CM

## 2015-12-24 DIAGNOSIS — Z3A31 31 weeks gestation of pregnancy: Secondary | ICD-10-CM | POA: Diagnosis not present

## 2015-12-24 DIAGNOSIS — O99810 Abnormal glucose complicating pregnancy: Secondary | ICD-10-CM | POA: Insufficient documentation

## 2015-12-24 DIAGNOSIS — O09292 Supervision of pregnancy with other poor reproductive or obstetric history, second trimester: Secondary | ICD-10-CM

## 2015-12-24 DIAGNOSIS — Z8632 Personal history of gestational diabetes: Secondary | ICD-10-CM

## 2015-12-24 DIAGNOSIS — N83209 Unspecified ovarian cyst, unspecified side: Secondary | ICD-10-CM

## 2015-12-24 DIAGNOSIS — O3482 Maternal care for other abnormalities of pelvic organs, second trimester: Secondary | ICD-10-CM

## 2015-12-25 ENCOUNTER — Ambulatory Visit (INDEPENDENT_AMBULATORY_CARE_PROVIDER_SITE_OTHER): Payer: Medicaid Other | Admitting: Certified Nurse Midwife

## 2015-12-25 ENCOUNTER — Encounter: Payer: Self-pay | Admitting: Certified Nurse Midwife

## 2015-12-25 ENCOUNTER — Encounter: Payer: Medicaid Other | Admitting: Certified Nurse Midwife

## 2015-12-25 VITALS — BP 106/77 | HR 92 | Temp 98.6°F | Wt 194.8 lb

## 2015-12-25 DIAGNOSIS — Z789 Other specified health status: Secondary | ICD-10-CM

## 2015-12-25 DIAGNOSIS — O09523 Supervision of elderly multigravida, third trimester: Secondary | ICD-10-CM | POA: Diagnosis not present

## 2015-12-25 DIAGNOSIS — O34219 Maternal care for unspecified type scar from previous cesarean delivery: Secondary | ICD-10-CM | POA: Diagnosis not present

## 2015-12-25 DIAGNOSIS — O0993 Supervision of high risk pregnancy, unspecified, third trimester: Secondary | ICD-10-CM

## 2015-12-25 LAB — POCT URINALYSIS DIP (DEVICE)
Bilirubin Urine: NEGATIVE
Glucose, UA: NEGATIVE mg/dL
Hgb urine dipstick: NEGATIVE
Ketones, ur: NEGATIVE mg/dL
Leukocytes, UA: NEGATIVE
Nitrite: NEGATIVE
Protein, ur: 30 mg/dL — AB
Specific Gravity, Urine: 1.02 (ref 1.005–1.030)
Urobilinogen, UA: 0.2 mg/dL (ref 0.0–1.0)
pH: 7 (ref 5.0–8.0)

## 2015-12-25 NOTE — Progress Notes (Signed)
Guidelines for Antenatal Testing and Sonography   INDICATION U/S NST/AFI DELIVERY  Diabetes   A1 - good control - 648.83    A2 - good control    Poor control or poor compliance    (Macrosomia or polyhydramnios)    B-C and A2/B - 648.03    D-R-F-T or poor control B-C  20-38  20-38  20-24-28-32-36   20-24-28-32-35-38//fetal echo  20-24-27-30-33-36-38//fetal echo  40  32//2 x wk  32//2 x wk   32//2 x wk   28//BPP wkly then 32//2 x wk  40  39  PRN   39   PRN  CHTN - 642.03   Group I   BP < 140/90, no preeclampsia, AGA,  nml AFV, +/- meds     Group II   BP > 140/90, on meds, no preeclampsia, AGA, nml AFV  20-28-34-38   20-24-28-32-35-38  32//2 x wk   28//BPP wkly then 32//2 x wk  40 no meds; 39 meds   PRN or 37  Pre-eclampsia  Mild  - 642.43/GHTN 642.33   Severe - 642.53  Q  3-4wks  Q 2 wks  28//BPP wkly then 32//2 x wk  Inpatient  37  PRN or 34  IUGR- 656.53   EFW < 10% w/ AEDF & low AFV or EFW < 3%     EFW < 10%, Nml Dopplers & AFV, AC<3%, no other comorbidities  PRN  20-24-28-30-32-34-36-38  Inpatient  24//BPP+dopplers wkly, then  32//2 x wk  PRN  PRN or 39  Graves Disease - 648.13 20-28-32-36 32//2 x wk 39  Multiple Gestation - 651.03       MC/DA    Concordant (< 20%) nml AFV, AGA       Discordant (>20%)               DC/DA  Concordant (<20%), nml AFV, AGA, no other comorbidities   Discordant (> 20%)     Q 2 wks 16-32, q 3wks 32-delivery Q 1 wk, Fluid alternating w/ growth  20-24-28-32-36  Q 2-3 wks   32//2 x wk  24//BPP wkly then 32//2 x wk   35//2 x wk  28//BPP wkly then 32//2 x wk   37-38  PRN   38  PRN or 37   Advanced maternal Age > 42 y.o. - 659.63 20-24-28-32-36 36//2 x wk 40  Previous Stillbirth (> 28 wks) - V23.5 20-24-28-32-36 28//BPP wkly then 32//2 x wk 39  Oligohydramnios - 658.03    AFV < 5, AGA, nml anatomy, no other comorbidities  Q 2 wks  28//BPP wkly then 32//2 x wk  PRN  or 37  Cholestasis - 646.73 At dx, then q 3 wks 28//BPP wkly then 32//2 x wk PRN or 37  Polyhydramnios - 657.03   Nml anatomy 20-24-28-32-36 28//BPP wkly then 32//2 x wk 39  Renal Disease - 646.23   Cr > 1.2, proteinuria 20-24-28-32-36 28//BPP wkly then 32//2 x wk 39  SLE (lupus) - 648.93 20-24-28-32-36 32//2 x wk 39  Sickle Cell Disease - 648.23 20-24-28-32-36 32//2 x wk 39  HIV - 042, 647.63 20-28-36 PRN 39  Decreased Fetal Movement- 655.73  2 x wk PRN; BPP wkly if < 32 wks   **2 x week testing = NST 2 x week + AFI weekly**   **If NST is nonreactive, will then need formal BPP** **PRN could be MD/MFM decision, or based on FLM or other criteria Subjective:  Lindsey SmallMina Molina is a 40 y.o. G3P2002 at 453w1d  being seen today for ongoing prenatal care.  She is currently monitored for the following issues for this high-risk pregnancy and has Hypoadrenalism (HCC); Anemia of mother in pregnancy, antepartum; Ovarian cyst in pregnancy; Supervision of high risk pregnancy, antepartum; Previous cesarean delivery, antepartum; Language barrier affecting health care; H/O gestational diabetes in prior pregnancy, currently pregnant; and Advanced maternal age in multigravida on her problem list. Reviewed u/s results and explained that she will need 2X weekly testing starting at 36 weeks. Reviewed MAU visit. Pt hgb 11.2  Patient reports fatigue and nausea.  Contractions: Not present. Vag. Bleeding: None.  Movement: Present. Denies leaking of fluid.   The following portions of the patient's history were reviewed and updated as appropriate: allergies, current medications, past family history, past medical history, past social history, past surgical history and problem list. Problem list updated.  Objective:   Filed Vitals:   12/25/15 0931  BP: 106/77  Pulse: 92  Temp: 98.6 F (37 C)  Weight: 194 lb 12.8 oz (88.361 kg)    Fetal Status:   Fundal Height: 36 cm Movement: Present     General:  Alert, oriented and  cooperative. Patient is in no acute distress.  Skin: Skin is warm and dry. No rash noted.   Cardiovascular: Normal heart rate noted  Respiratory: Normal respiratory effort, no problems with respiration noted  Abdomen: Soft, gravid, appropriate for gestational age. Pain/Pressure: Absent     Pelvic: Vag. Bleeding: None     Cervical exam deferred        Extremities: Normal range of motion.  Edema: None  Mental Status: Normal mood and affect. Normal behavior. Normal judgment and thought content.   Urinalysis: Urine Protein: 1+ Urine Glucose: Negative  Assessment and Plan:  Pregnancy: G3P2002 at [redacted]w[redacted]d  There are no diagnoses linked to this encounter. Preterm labor symptoms and general obstetric precautions including but not limited to vaginal bleeding, contractions, leaking of fluid and fetal movement were reviewed in detail with the patient. Please refer to After Visit Summary for other counseling recommendations.  Return in about 2 weeks (around 01/08/2016).   Rhea Pink, CNM

## 2015-12-25 NOTE — Progress Notes (Signed)
[employer] video interpreter - Claudine # [id number].  Pt states she went to MAU 5 days ago due to feeling tired, dizzy and nausea.

## 2016-01-08 ENCOUNTER — Ambulatory Visit (INDEPENDENT_AMBULATORY_CARE_PROVIDER_SITE_OTHER): Payer: Medicaid Other | Admitting: Certified Nurse Midwife

## 2016-01-08 VITALS — BP 106/73 | HR 88 | Wt 197.9 lb

## 2016-01-08 DIAGNOSIS — O09523 Supervision of elderly multigravida, third trimester: Secondary | ICD-10-CM | POA: Diagnosis present

## 2016-01-08 DIAGNOSIS — O0993 Supervision of high risk pregnancy, unspecified, third trimester: Secondary | ICD-10-CM

## 2016-01-08 DIAGNOSIS — O34219 Maternal care for unspecified type scar from previous cesarean delivery: Secondary | ICD-10-CM | POA: Diagnosis not present

## 2016-01-08 LAB — POCT URINALYSIS DIP (DEVICE)
Bilirubin Urine: NEGATIVE
GLUCOSE, UA: NEGATIVE mg/dL
Hgb urine dipstick: NEGATIVE
KETONES UR: NEGATIVE mg/dL
Leukocytes, UA: NEGATIVE
NITRITE: NEGATIVE
PROTEIN: 30 mg/dL — AB
Specific Gravity, Urine: >=1.03 (ref 1.005–1.030)
Urobilinogen, UA: 0.2 mg/dL (ref 0.0–1.0)
pH: 5 (ref 5.0–8.0)

## 2016-01-08 NOTE — Progress Notes (Signed)
Subjective:  Lindsey Molina is a 40 y.o. G3P2002 at 2043w1d being seen today for ongoing prenatal care.  She is currently monitored for the following issues for this high-risk pregnancy and has Hypoadrenalism (HCC); Anemia of mother in pregnancy, antepartum; Ovarian cyst in pregnancy; Supervision of high risk pregnancy, antepartum; Previous cesarean delivery, antepartum; Language barrier affecting health care; H/O gestational diabetes in prior pregnancy, currently pregnant; and Advanced maternal age in multigravida on her problem list.  Patient reports no complaints.  Contractions: Not present. Vag. Bleeding: None.  Movement: Present. Denies leaking of fluid.   The following portions of the patient's history were reviewed and updated as appropriate: allergies, current medications, past family history, past medical history, past social history, past surgical history and problem list. Problem list updated.  Objective:   Filed Vitals:   01/08/16 0933  BP: 106/73  Pulse: 88  Weight: 197 lb 14.4 oz (89.767 kg)    Fetal Status: Fetal Heart Rate (bpm): 155   Movement: Present     General:  Alert, oriented and cooperative. Patient is in no acute distress.  Skin: Skin is warm and dry. No rash noted.   Cardiovascular: Normal heart rate noted  Respiratory: Normal respiratory effort, no problems with respiration noted  Abdomen: Soft, gravid, appropriate for gestational age. Pain/Pressure: Absent     Pelvic: Vag. Bleeding: None     Cervical exam deferred        Extremities: Normal range of motion.  Edema: None  Mental Status: Normal mood and affect. Normal behavior. Normal judgment and thought content.   Urinalysis: Urine Protein: 1+ Urine Glucose: Negative  Assessment and Plan:  Pregnancy: G3P2002 at 5643w1d  1. Advanced maternal age in multigravida, third trimester   2. Supervision of high risk pregnancy, antepartum, third trimester   3. Previous cesarean delivery, antepartum   Preterm  labor symptoms and general obstetric precautions including but not limited to vaginal bleeding, contractions, leaking of fluid and fetal movement were reviewed in detail with the patient. Please refer to After Visit Summary for other counseling recommendations.  Return in about 2 weeks (around 01/22/2016).   Rhea PinkLori A Clemmons, CNM

## 2016-01-08 NOTE — Patient Instructions (Signed)

## 2016-01-08 NOTE — Progress Notes (Signed)
Breastfeeding tip of the week reviewed Arabic video interpreter Claudine [phone]

## 2016-01-21 ENCOUNTER — Ambulatory Visit (HOSPITAL_COMMUNITY)
Admission: RE | Admit: 2016-01-21 | Discharge: 2016-01-21 | Disposition: A | Discharge status: Home / Self Care | Payer: Medicaid Other | Source: Ambulatory Visit | Attending: Advanced Practice Midwife | Admitting: Advanced Practice Midwife

## 2016-01-21 ENCOUNTER — Encounter (HOSPITAL_COMMUNITY): Payer: Self-pay

## 2016-01-21 DIAGNOSIS — O09529 Supervision of elderly multigravida, unspecified trimester: Secondary | ICD-10-CM

## 2016-01-21 DIAGNOSIS — O09293 Supervision of pregnancy with other poor reproductive or obstetric history, third trimester: Secondary | ICD-10-CM | POA: Diagnosis not present

## 2016-01-21 DIAGNOSIS — Z3A35 35 weeks gestation of pregnancy: Secondary | ICD-10-CM | POA: Diagnosis not present

## 2016-01-21 DIAGNOSIS — O09523 Supervision of elderly multigravida, third trimester: Secondary | ICD-10-CM | POA: Diagnosis not present

## 2016-01-21 DIAGNOSIS — O34219 Maternal care for unspecified type scar from previous cesarean delivery: Secondary | ICD-10-CM | POA: Insufficient documentation

## 2016-01-22 ENCOUNTER — Ambulatory Visit (INDEPENDENT_AMBULATORY_CARE_PROVIDER_SITE_OTHER): Payer: Medicaid Other | Admitting: Family

## 2016-01-22 ENCOUNTER — Encounter (HOSPITAL_COMMUNITY): Payer: Self-pay | Admitting: *Deleted

## 2016-01-22 VITALS — BP 118/70 | HR 87 | Wt 199.8 lb

## 2016-01-22 DIAGNOSIS — K219 Gastro-esophageal reflux disease without esophagitis: Secondary | ICD-10-CM

## 2016-01-22 DIAGNOSIS — O0993 Supervision of high risk pregnancy, unspecified, third trimester: Secondary | ICD-10-CM

## 2016-01-22 DIAGNOSIS — Z789 Other specified health status: Secondary | ICD-10-CM | POA: Diagnosis not present

## 2016-01-22 DIAGNOSIS — D649 Anemia, unspecified: Secondary | ICD-10-CM

## 2016-01-22 DIAGNOSIS — O3483 Maternal care for other abnormalities of pelvic organs, third trimester: Secondary | ICD-10-CM | POA: Diagnosis not present

## 2016-01-22 DIAGNOSIS — O34219 Maternal care for unspecified type scar from previous cesarean delivery: Secondary | ICD-10-CM

## 2016-01-22 DIAGNOSIS — O99013 Anemia complicating pregnancy, third trimester: Secondary | ICD-10-CM

## 2016-01-22 DIAGNOSIS — O09523 Supervision of elderly multigravida, third trimester: Secondary | ICD-10-CM

## 2016-01-22 LAB — POCT URINALYSIS DIP (DEVICE)
BILIRUBIN URINE: NEGATIVE
GLUCOSE, UA: NEGATIVE mg/dL
Ketones, ur: NEGATIVE mg/dL
LEUKOCYTES UA: NEGATIVE
NITRITE: NEGATIVE
Protein, ur: 30 mg/dL — AB
Specific Gravity, Urine: 1.015 (ref 1.005–1.030)
UROBILINOGEN UA: 0.2 mg/dL (ref 0.0–1.0)
pH: 5.5 (ref 5.0–8.0)

## 2016-01-22 MED ORDER — METOCLOPRAMIDE HCL 10 MG PO TABS: 10.0000 mg | ORAL_TABLET | Freq: Every day | ORAL | Status: DC

## 2016-01-22 NOTE — Progress Notes (Signed)
Subjective:  Lindsey SmallMina Vanmaanen is a 40 y.o. G3P2002 at 5526w1d being seen today for ongoing prenatal care.  She is currently monitored for the following issues for this high-risk pregnancy and has Hypoadrenalism (HCC); Anemia of mother in pregnancy, antepartum; Ovarian cyst in pregnancy; Supervision of high risk pregnancy, antepartum; Previous cesarean delivery, antepartum; Language barrier affecting health care; H/O gestational diabetes in prior pregnancy, currently pregnant; and Advanced maternal age in multigravida on her problem list.  Patient reports GERD symptoms that patient states reglan helps with.  Contractions: Not present. Vag. Bleeding: None.  Movement: Present. Denies leaking of fluid.   The following portions of the patient's history were reviewed and updated as appropriate: allergies, current medications, past family history, past medical history, past social history, past surgical history and problem list. Problem list updated.  Objective:   Filed Vitals:   01/22/16 1022  BP: 118/70  Pulse: 87  Weight: 199 lb 12.8 oz (90.629 kg)    Fetal Status: Fetal Heart Rate (bpm): 155 Fundal Height: 36 cm Movement: Present     General:  Alert, oriented and cooperative. Patient is in no acute distress.  Skin: Skin is warm and dry. No rash noted.   Cardiovascular: Normal heart rate noted  Respiratory: Normal respiratory effort, no problems with respiration noted  Abdomen: Soft, gravid, appropriate for gestational age. Pain/Pressure: Absent     Pelvic: Vag. Bleeding: None     Cervical exam deferred        Extremities: Normal range of motion.  Edema: None  Mental Status: Normal mood and affect. Normal behavior. Normal judgment and thought content.   Urinalysis: Urine Protein: 1+ Urine Glucose: Negative  Assessment and Plan:  Pregnancy: G3P2002 at 5726w1d  1. Supervision of high risk pregnancy, antepartum, third trimester - Continue monitoring  2. Gastroesophageal reflux disease,  esophagitis presence not specified - metoCLOPramide (REGLAN) 10 MG tablet; Take 1 tablet (10 mg total) by mouth daily.  Dispense: 30 tablet; Refill: 2  3. AMA (advanced maternal age) multigravida 35+, third trimester - US MFM FETAL BPP WO NON STRESS; Future - Growth ultrasound yesterday - 66%ile - Begin fetal testing next week  4. Previous cesarean delivery, antepartum - Message routed to [place] regarding scheduling csection  5. Language barrier affecting health care - Utilized video interpreter [phone]   Preterm labor symptoms and general obstetric precautions including but not limited to vaginal bleeding, contractions, leaking of fluid and fetal movement were reviewed in detail with the patient. Please refer to After Visit Summary for other counseling recommendations.  Return for appt and NST.   Eino FarberWalidah Kennith GainN Karim, CNM

## 2016-01-22 NOTE — Progress Notes (Signed)
Video interpreter [employer] [phone]

## 2016-01-29 ENCOUNTER — Other Ambulatory Visit (HOSPITAL_COMMUNITY)
Admission: RE | Admit: 2016-01-29 | Discharge: 2016-01-29 | Disposition: A | Discharge status: Home / Self Care | Payer: Medicaid Other | Source: Ambulatory Visit | Attending: Obstetrics and Gynecology | Admitting: Obstetrics and Gynecology

## 2016-01-29 ENCOUNTER — Ambulatory Visit (INDEPENDENT_AMBULATORY_CARE_PROVIDER_SITE_OTHER): Payer: Medicaid Other | Admitting: Student

## 2016-01-29 ENCOUNTER — Other Ambulatory Visit: Payer: Medicaid Other

## 2016-01-29 ENCOUNTER — Other Ambulatory Visit: Payer: Self-pay | Admitting: Family

## 2016-01-29 ENCOUNTER — Ambulatory Visit (HOSPITAL_COMMUNITY)
Admission: RE | Admit: 2016-01-29 | Discharge: 2016-01-29 | Disposition: A | Discharge status: Home / Self Care | Payer: Medicaid Other | Source: Ambulatory Visit | Attending: Family | Admitting: Family

## 2016-01-29 ENCOUNTER — Encounter (HOSPITAL_COMMUNITY): Payer: Self-pay

## 2016-01-29 VITALS — BP 109/70 | HR 91 | Wt 202.0 lb

## 2016-01-29 DIAGNOSIS — Z113 Encounter for screening for infections with a predominantly sexual mode of transmission: Secondary | ICD-10-CM | POA: Diagnosis not present

## 2016-01-29 DIAGNOSIS — O34219 Maternal care for unspecified type scar from previous cesarean delivery: Secondary | ICD-10-CM | POA: Diagnosis not present

## 2016-01-29 DIAGNOSIS — Z8632 Personal history of gestational diabetes: Secondary | ICD-10-CM

## 2016-01-29 DIAGNOSIS — O99810 Abnormal glucose complicating pregnancy: Secondary | ICD-10-CM | POA: Insufficient documentation

## 2016-01-29 DIAGNOSIS — Z3A36 36 weeks gestation of pregnancy: Secondary | ICD-10-CM

## 2016-01-29 DIAGNOSIS — O09293 Supervision of pregnancy with other poor reproductive or obstetric history, third trimester: Secondary | ICD-10-CM | POA: Diagnosis not present

## 2016-01-29 DIAGNOSIS — O09523 Supervision of elderly multigravida, third trimester: Secondary | ICD-10-CM

## 2016-01-29 DIAGNOSIS — Z789 Other specified health status: Secondary | ICD-10-CM

## 2016-01-29 DIAGNOSIS — O0993 Supervision of high risk pregnancy, unspecified, third trimester: Secondary | ICD-10-CM

## 2016-01-29 LAB — POCT URINALYSIS DIP (DEVICE)
Bilirubin Urine: NEGATIVE
Glucose, UA: NEGATIVE mg/dL
KETONES UR: NEGATIVE mg/dL
LEUKOCYTES UA: NEGATIVE
Nitrite: NEGATIVE
Protein, ur: 30 mg/dL — AB
SPECIFIC GRAVITY, URINE: 1.02 (ref 1.005–1.030)
UROBILINOGEN UA: 0.2 mg/dL (ref 0.0–1.0)
pH: 5.5 (ref 5.0–8.0)

## 2016-01-29 NOTE — Progress Notes (Signed)
Subjective:  Lindsey SmallMina Molina is a 40 y.o. G3P2002 at 6944w1d being seen today for ongoing prenatal care.  She is currently monitored for the following issues for this high-risk pregnancy and has Hypoadrenalism (HCC); Anemia of mother in pregnancy, antepartum; Ovarian cyst in pregnancy; Supervision of high risk pregnancy, antepartum; Previous cesarean delivery, antepartum; Language barrier affecting health care; H/O gestational diabetes in prior pregnancy, currently pregnant; and Advanced maternal age in multigravida on her problem list.  Patient reports no complaints.  Contractions: Not present. Vag. Bleeding: None.  Movement: Present. Denies leaking of fluid.   The following portions of the patient's history were reviewed and updated as appropriate: allergies, current medications, past family history, past medical history, past social history, past surgical history and problem list. Problem list updated.  Objective:   Filed Vitals:   01/29/16 0953  BP: 109/70  Pulse: 91  Weight: 202 lb (91.627 kg)    Fetal Status:     Movement: Present     General:  Alert, oriented and cooperative. Patient is in no acute distress.  Skin: Skin is warm and dry. No rash noted.   Cardiovascular: Normal heart rate noted  Respiratory: Normal respiratory effort, no problems with respiration noted  Abdomen: Soft, gravid, appropriate for gestational age. Pain/Pressure: Absent     Pelvic: Vag. Bleeding: None     Cervical exam performed        0/0/-3  Extremities: Normal range of motion.  Edema: None  Mental Status: Normal mood and affect. Normal behavior. Normal judgment and thought content.   Urinalysis: Urine Protein: 1+ Urine Glucose: Negative  Assessment and Plan:  Pregnancy: G3P2002 at 5844w1d  1. Advanced maternal age in multigravida, third trimester  - Fetal nonstress test - Culture, beta strep (group b only) - GC/Chlamydia probe amp ([place])not at [facility] -reactive NST -BPP today  2.  Supervision of high risk pregnancy, antepartum, third trimester   3. Language barrier affecting health care -phone interpreter used  Term labor symptoms and general obstetric precautions including but not limited to vaginal bleeding, contractions, leaking of fluid and fetal movement were reviewed in detail with the patient. Please refer to After Visit Summary for other counseling recommendations.  Return in about 7 days (around 02/05/2016) for NST/AFI.   Judeth HornErin Jakita Dutkiewicz, NP

## 2016-01-29 NOTE — Patient Instructions (Signed)
Braxton Hicks Contractions °Contractions of the uterus can occur throughout pregnancy. Contractions are not always a sign that you are in labor.  °WHAT ARE BRAXTON HICKS CONTRACTIONS?  °Contractions that occur before labor are called Braxton Hicks contractions, or false labor. Toward the end of pregnancy (32-34 weeks), these contractions can develop more often and may become more forceful. This is not true labor because these contractions do not result in opening (dilatation) and thinning of the cervix. They are sometimes difficult to tell apart from true labor because these contractions can be forceful and people have different pain tolerances. You should not feel embarrassed if you go to the hospital with false labor. Sometimes, the only way to tell if you are in true labor is for your health care provider to look for changes in the cervix. °If there are no prenatal problems or other health problems associated with the pregnancy, it is completely safe to be sent home with false labor and await the onset of true labor. °HOW CAN YOU TELL THE DIFFERENCE BETWEEN TRUE AND FALSE LABOR? °False Labor °· The contractions of false labor are usually shorter and not as hard as those of true labor.   °· The contractions are usually irregular.   °· The contractions are often felt in the front of the lower abdomen and in the groin.   °· The contractions may go away when you walk around or change positions while lying down.   °· The contractions get weaker and are shorter lasting as time goes on.   °· The contractions do not usually become progressively stronger, regular, and closer together as with true labor.   °True Labor °· Contractions in true labor last 30-70 seconds, become very regular, usually become more intense, and increase in frequency.   °· The contractions do not go away with walking.   °· The discomfort is usually felt in the top of the uterus and spreads to the lower abdomen and low back.   °· True labor can be  determined by your health care provider with an exam. This will show that the cervix is dilating and getting thinner.   °WHAT TO REMEMBER °· Keep up with your usual exercises and follow other instructions given by your health care provider.   °· Take medicines as directed by your health care provider.   °· Keep your regular prenatal appointments.   °· Eat and drink lightly if you think you are going into labor.   °· If Braxton Hicks contractions are making you uncomfortable:   °¨ Change your position from lying down or resting to walking, or from walking to resting.   °¨ Sit and rest in a tub of warm water.   °¨ Drink 2-3 glasses of water. Dehydration may cause these contractions.   °¨ Do slow and deep breathing several times an hour.   °WHEN SHOULD I SEEK IMMEDIATE MEDICAL CARE? °Seek immediate medical care if: °· Your contractions become stronger, more regular, and closer together.   °· You have fluid leaking or gushing from your vagina.   °· You have a fever.   °· You pass blood-tinged mucus.   °· You have vaginal bleeding.   °· You have continuous abdominal pain.   °· You have low back pain that you never had before.   °· You feel your baby's head pushing down and causing pelvic pressure.   °· Your baby is not moving as much as it used to.   °  °This information is not intended to replace advice given to you by your health care provider. Make sure you discuss any questions you have with your health care   provider. °  °Document Released: 09/14/2005 Document Revised: 09/19/2013 Document Reviewed: 06/26/2013 °Elsevier Interactive Patient Education ©2016 [employer] ° °

## 2016-01-29 NOTE — Progress Notes (Signed)
[employer] interpreter [phone] used for today's visit

## 2016-01-30 LAB — GC/CHLAMYDIA PROBE AMP (~~LOC~~) NOT AT ARMC
CHLAMYDIA, DNA PROBE: NEGATIVE
NEISSERIA GONORRHEA: NEGATIVE

## 2016-01-31 LAB — CULTURE, BETA STREP (GROUP B ONLY)

## 2016-02-05 ENCOUNTER — Ambulatory Visit (INDEPENDENT_AMBULATORY_CARE_PROVIDER_SITE_OTHER): Payer: Medicaid Other | Admitting: Advanced Practice Midwife

## 2016-02-05 VITALS — BP 110/74 | HR 89 | Wt 202.5 lb

## 2016-02-05 DIAGNOSIS — O09523 Supervision of elderly multigravida, third trimester: Secondary | ICD-10-CM

## 2016-02-05 DIAGNOSIS — Z36 Encounter for antenatal screening of mother: Secondary | ICD-10-CM | POA: Diagnosis not present

## 2016-02-05 DIAGNOSIS — O34219 Maternal care for unspecified type scar from previous cesarean delivery: Secondary | ICD-10-CM | POA: Diagnosis not present

## 2016-02-05 DIAGNOSIS — O9989 Other specified diseases and conditions complicating pregnancy, childbirth and the puerperium: Secondary | ICD-10-CM

## 2016-02-05 DIAGNOSIS — Z789 Other specified health status: Secondary | ICD-10-CM

## 2016-02-05 DIAGNOSIS — O26899 Other specified pregnancy related conditions, unspecified trimester: Secondary | ICD-10-CM

## 2016-02-05 DIAGNOSIS — R109 Unspecified abdominal pain: Secondary | ICD-10-CM

## 2016-02-05 LAB — POCT URINALYSIS DIP (DEVICE)
Bilirubin Urine: NEGATIVE
GLUCOSE, UA: NEGATIVE mg/dL
Ketones, ur: NEGATIVE mg/dL
NITRITE: NEGATIVE
Protein, ur: 30 mg/dL — AB
Specific Gravity, Urine: 1.015 (ref 1.005–1.030)
UROBILINOGEN UA: 0.2 mg/dL (ref 0.0–1.0)
pH: 5.5 (ref 5.0–8.0)

## 2016-02-05 NOTE — Progress Notes (Signed)
[employer] video interpreter Amal #[id number] used for encounter. Pt states she cannot come to twice weekly appts. She requests one appt per week. BPP scheduled on 5/16 w/next Ob visit and NST. Rpt C/S scheduled 5/23.

## 2016-02-05 NOTE — Progress Notes (Signed)
Subjective:  Lindsey SmallMina Molina is a 40 y.o. G3P2002 at 4358w1d being seen today for ongoing prenatal care.  She is currently monitored for the following issues for this high-risk pregnancy and has Hypoadrenalism (HCC); Anemia of mother in pregnancy, antepartum; Ovarian cyst in pregnancy; Supervision of high risk pregnancy, antepartum; Previous cesarean delivery, antepartum; Language barrier affecting health care; H/O gestational diabetes in prior pregnancy, currently pregnant; and Advanced maternal age in multigravida on her problem list.  Patient reports intermittent abdominal pain 1-3x/day.  Contractions: Irregular. Vag. Bleeding: None.  Movement: Present. Denies leaking of fluid.   The following portions of the patient's history were reviewed and updated as appropriate: allergies, current medications, past family history, past medical history, past social history, past surgical history and problem list. Problem list updated.  Objective:   Filed Vitals:   02/05/16 0918  BP: 110/74  Pulse: 89  Weight: 202 lb 8 oz (91.853 kg)    Fetal Status: Fetal Heart Rate (bpm): NST-R   Movement: Present  Presentation: Vertex  General:  Alert, oriented and cooperative. Patient is in no acute distress.  Skin: Skin is warm and dry. No rash noted.   Cardiovascular: Normal heart rate noted  Respiratory: Normal respiratory effort, no problems with respiration noted  Abdomen: Soft, gravid, appropriate for gestational age. Pain/Pressure: Present     Pelvic: Vag. Bleeding: None     Cervical exam deferred        Extremities: Normal range of motion.  Edema: None  Mental Status: Normal mood and affect. Normal behavior. Normal judgment and thought content.   Urinalysis: Urine Protein: 1+ Urine Glucose: Negative  Assessment and Plan:  Pregnancy: G3P2002 at 3758w1d  1. Advanced maternal age in multigravida, third trimester  - US MFM FETAL BPP WO NON STRESS; Future - Amniotic fluid index with NST  2. Language  barrier affecting health care --Language line with arabic interpreter used  3. Previous cesarean delivery, antepartum --RLTCS scheduled at 39 weeks  4. Abdominal pain during pregnancy, antepartum --Rest/ice/heat/warm bath/Tylenol for pain.  Term labor symptoms and general obstetric precautions including but not limited to vaginal bleeding, contractions, leaking of fluid and fetal movement were reviewed in detail with the patient. Please refer to After Visit Summary for other counseling recommendations.  Return in about 6 days (around 02/11/2016) for as scheduled.   Hurshel PartyLisa A Leftwich-Kirby, CNM

## 2016-02-07 ENCOUNTER — Telehealth (HOSPITAL_COMMUNITY): Payer: Self-pay | Admitting: *Deleted

## 2016-02-07 NOTE — Telephone Encounter (Signed)
Preadmission screen  

## 2016-02-07 NOTE — Pre-Procedure Instructions (Signed)
Interpreter number [id number] [phone]

## 2016-02-10 ENCOUNTER — Encounter (HOSPITAL_COMMUNITY): Payer: Self-pay

## 2016-02-10 NOTE — Pre-Procedure Instructions (Signed)
Interpreter number [id number] 

## 2016-02-11 ENCOUNTER — Ambulatory Visit (INDEPENDENT_AMBULATORY_CARE_PROVIDER_SITE_OTHER): Payer: Medicaid Other | Admitting: Family

## 2016-02-11 ENCOUNTER — Ambulatory Visit (HOSPITAL_COMMUNITY)
Admission: RE | Admit: 2016-02-11 | Discharge: 2016-02-11 | Disposition: A | Discharge status: Home / Self Care | Payer: Medicaid Other | Source: Ambulatory Visit | Attending: Advanced Practice Midwife | Admitting: Advanced Practice Midwife

## 2016-02-11 VITALS — BP 107/66 | HR 85 | Wt 206.3 lb

## 2016-02-11 DIAGNOSIS — O09523 Supervision of elderly multigravida, third trimester: Secondary | ICD-10-CM | POA: Diagnosis present

## 2016-02-11 DIAGNOSIS — O09293 Supervision of pregnancy with other poor reproductive or obstetric history, third trimester: Secondary | ICD-10-CM | POA: Diagnosis not present

## 2016-02-11 DIAGNOSIS — Z3A38 38 weeks gestation of pregnancy: Secondary | ICD-10-CM | POA: Insufficient documentation

## 2016-02-11 DIAGNOSIS — O34219 Maternal care for unspecified type scar from previous cesarean delivery: Secondary | ICD-10-CM

## 2016-02-11 DIAGNOSIS — O0993 Supervision of high risk pregnancy, unspecified, third trimester: Secondary | ICD-10-CM

## 2016-02-11 DIAGNOSIS — O99810 Abnormal glucose complicating pregnancy: Secondary | ICD-10-CM | POA: Insufficient documentation

## 2016-02-11 DIAGNOSIS — Z789 Other specified health status: Secondary | ICD-10-CM

## 2016-02-11 LAB — POCT URINALYSIS DIP (DEVICE)
BILIRUBIN URINE: NEGATIVE
Bilirubin Urine: NEGATIVE
GLUCOSE, UA: NEGATIVE mg/dL
Glucose, UA: NEGATIVE mg/dL
KETONES UR: NEGATIVE mg/dL
Ketones, ur: NEGATIVE mg/dL
NITRITE: NEGATIVE
Nitrite: NEGATIVE
PH: 5.5 (ref 5.0–8.0)
PROTEIN: NEGATIVE mg/dL
Protein, ur: NEGATIVE mg/dL
UROBILINOGEN UA: 0.2 mg/dL (ref 0.0–1.0)
Urobilinogen, UA: 0.2 mg/dL (ref 0.0–1.0)
pH: 5.5 (ref 5.0–8.0)

## 2016-02-11 NOTE — Progress Notes (Signed)
[employer] video interpreter Hamed  [phone] used for encounter.  BPP today @ 1130 since pt is not able to come for twice weekly fetal testing appts.  Rpt C/S scheduled 5/23.

## 2016-02-11 NOTE — Progress Notes (Signed)
Subjective:  Lindsey Molina is a 40 y.o. G3P2002 at 1711w0d being seen today for ongoing prenatal care.  She is currently monitored for the following issues for this high-risk pregnancy and has Hypoadrenalism (HCC); Anemia of mother in pregnancy, antepartum; Ovarian cyst in pregnancy; Supervision of high risk pregnancy, antepartum; Previous cesarean delivery, antepartum; Language barrier affecting health care; H/O gestational diabetes in prior pregnancy, currently pregnant; and Advanced maternal age in multigravida on her problem list.  Patient reports decreased fetal movement.  +movement since arrival.  Contractions: Irregular. Vag. Bleeding: None.  Movement: (!) Decreased. Denies leaking of fluid.   The following portions of the patient's history were reviewed and updated as appropriate: allergies, current medications, past family history, past medical history, past social history, past surgical history and problem list. Problem list updated.  Objective:   Filed Vitals:   02/11/16 1004  BP: 107/66  Pulse: 85  Weight: 206 lb 4.8 oz (93.577 kg)    Fetal Status: Fetal Heart Rate (bpm): NST-R Fundal Height: 38 cm Movement: (!) Decreased     General:  Alert, oriented and cooperative. Patient is in no acute distress.  Skin: Skin is warm and dry. No rash noted.   Cardiovascular: Normal heart rate noted  Respiratory: Normal respiratory effort, no problems with respiration noted  Abdomen: Soft, gravid, appropriate for gestational age. Pain/Pressure: Present     Pelvic: Vag. Bleeding: None     Cervical exam deferred        Extremities: Normal range of motion.  Edema: None  Mental Status: Normal mood and affect. Normal behavior. Normal judgment and thought content.   Urinalysis:     Urine results not available at discharge.     Assessment and Plan:  Pregnancy: G3P2002 at 6911w0d  1. Advanced maternal age in multigravida, third trimester - NST today, Reactive; BPP today at 11:30  2.  Supervision of high risk pregnancy, antepartum, third trimester - Continue observation  3. Previous cesarean delivery, antepartum - Repeat scheduled for 07/20/16  4. Language barrier affecting health care - Video interpreter Hamed utilized   Term labor symptoms and general obstetric precautions including but not limited to vaginal bleeding, contractions, leaking of fluid and fetal movement were reviewed in detail with the patient. Please refer to After Visit Summary for other counseling recommendations.  Return in about 6 weeks (around 03/24/2016) for PP visit.  C/S on 5/23.   Eino FarberWalidah Kennith GainN Karim, CNM

## 2016-02-13 NOTE — H&P (Signed)
Lindsey Molina is an 40 y.o. [phone] [redacted]w[redacted]d female.   Chief Complaint: Previous C-Section x 2  HPI: [redacted] wk gestation for elective repeat C-section  Past Medical History  Diagnosis Date  . Syncope and collapse 2011; 05/24/2012  . Hypotension 05/25/2012  . Dizziness 05/25/2012    "severe; and I pass out"  . Shortness of breath 05/25/2012    "occurs when I pass out"  . Respiratory arrest (HCC) 05/25/2012    "occurs when I black out; I can't breath"  . Numbness and tingling 05/25/2012    "and pain right arm when I get mad,  nervous and/or blackout; sometimes; not always"  . 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 Depression     current pregnancy    Past Surgical History  Procedure Laterality Date  . Cesarean section  2005; 2011    Family History  Problem Relation Age of Onset  . Alcohol abuse Neg Hx   . Arthritis Neg Hx   . Asthma Neg Hx   . Birth defects Neg Hx   . Cancer Neg Hx   . COPD Neg Hx   . Depression Neg Hx   . Diabetes Neg Hx   . Drug abuse Neg Hx   . Early death Neg Hx   . Hearing loss Neg Hx   . Heart disease Neg Hx   . Hyperlipidemia Neg Hx   . Hypertension Neg Hx   . Kidney disease Neg Hx   . Learning disabilities Neg Hx   . Mental illness Neg Hx   . Mental retardation Neg Hx   . Miscarriages / Stillbirths Neg Hx   . Stroke Neg Hx   . Vision loss Neg Hx   . Varicose Veins Neg Hx    Social History:  reports that she has never smoked. She has never used smokeless tobacco. She reports that she does not drink alcohol or use illicit drugs.   No Known Allergies  No current facility-administered medications on file prior to encounter.   Current Outpatient Prescriptions on File Prior to Encounter  Medication Sig Dispense Refill  . folic acid (FOLVITE) 1 MG tablet Take 1 mg by mouth daily. Reported on 12/25/2015    . hydrocortisone (CORTEF) 10 MG tablet Take 10 mg by mouth daily. Reported on 01/29/2016    . metoCLOPramide  (REGLAN) 10 MG tablet Take 1 tablet (10 mg total) by mouth daily. (Patient not taking: Reported on 01/29/2016) 30 tablet 2  . Prenatal Vit-Fe Fumarate-FA (PRENATAL VITAMINS PLUS) 27-1 MG TABS Take 1 tablet by mouth daily. 30 tablet 11    A comprehensive review of systems was negative.  There were no vitals taken for this visit. General appearance: alert, cooperative and appears stated age Head: Normocephalic, without obvious abnormality, atraumatic Neck: supple, symmetrical, trachea midline Lungs: normal effort Heart: regular rate and rhythm Abdomen: gravid, NT Extremities: extremities normal, atraumatic, no cyanosis or edema Skin: Skin color, texture, turgor normal. No rashes or lesions Neurologic: Grossly normal   Lab Results  Component Value Date   WBC 6.8 12/20/2015   HGB 11.2* 12/20/2015   HCT 33.5* 12/20/2015   MCV 82.9 12/20/2015   PLT 108* 12/20/2015         ABO, Rh: O/POS/-- (11/17 1527)  Antibody: NEG (11/17 1527)  Rubella: !Error!  RPR: NON REAC (03/01 1137)  HBsAg: NEGATIVE (11/17 1527)  HIV: NONREACTIVE (03/01 1137)  GBS:  Assessment/Plan Active Problems:   Previous cesarean delivery, antepartum  For RLTCS Risks include but are not limited to bleeding, infection, injury to surrounding structures, including bowel, bladder and ureters, blood clots, and death.  Likelihood of success is high.   Denni France S 02/13/2016, 8:23 AM

## 2016-02-13 NOTE — Pre-Procedure Instructions (Signed)
[employer] interpreter reservations for 5/22 and 23.    Appt number [id number] appt number [id number]  Appt number [id number]  Appt number for 5/23 [phone]  [id number]  Call [facility] [phone] to access these appointments.

## 2016-02-14 ENCOUNTER — Other Ambulatory Visit: Payer: Medicaid Other

## 2016-02-17 ENCOUNTER — Encounter (HOSPITAL_COMMUNITY)
Admission: RE | Admit: 2016-02-17 | Discharge: 2016-02-17 | Disposition: A | Discharge status: Home / Self Care | Payer: Medicaid Other | Source: Ambulatory Visit | Attending: Family Medicine | Admitting: Family Medicine

## 2016-02-17 HISTORY — DX: Depression, unspecified: F32.A

## 2016-02-17 HISTORY — DX: Major depressive disorder, single episode, unspecified: F32.9

## 2016-02-17 LAB — CBC
HEMATOCRIT: 33.3 % — AB (ref 36.0–46.0)
HEMOGLOBIN: 10.9 g/dL — AB (ref 12.0–15.0)
MCH: 26.7 pg (ref 26.0–34.0)
MCHC: 32.7 g/dL (ref 30.0–36.0)
MCV: 81.4 fL (ref 78.0–100.0)
Platelets: 82 10*3/uL — ABNORMAL LOW (ref 150–400)
RBC: 4.09 MIL/uL (ref 3.87–5.11)
RDW: 15.2 % (ref 11.5–15.5)
WBC: 6.2 10*3/uL (ref 4.0–10.5)

## 2016-02-17 LAB — ABO/RH: ABO/RH(D): O POS

## 2016-02-17 NOTE — Patient Instructions (Addendum)
20 Maurice SmallMina Kroeger  02/17/2016   Your procedure is scheduled on:  02/18/2016  [facility] at 1100 AM.  Pick up the phone at the desk and dial [phone].   Call this number if you have problems the morning of surgery: [phone]   Remember:   Do not eat food:After Midnight.  Do not drink clear liquids: After Midnight.  Take these medicines the morning of surgery with A SIP OF WATER: cortef 10 mg (Hydrocortisone)   Do not wear jewelry, make-up or nail polish.  Do not wear lotions, powders, or perfumes. You may wear deodorant.  Do not shave 48 hours prior to surgery.  Do not bring valuables to the hospital.  [facility] not   responsible for any belongings or valuables brought to the hospital.  Contacts, dentures or bridgework may not be worn into surgery.  Leave suitcase in the car. After surgery it may be brought to your room.  For patients admitted to the hospital, checkout time is 11:00 AM the day of              discharge.   Patients discharged the day of surgery will not be allowed to drive             home.  Name and phone number of your driver: na  Special Instructions:   Shower using CHG 2 nights before surgery and the night before surgery.  If you shower the day of surgery use CHG.  Use special wash - you have one bottle of CHG for all showers.  You should use approximately 1/3 of the bottle for each shower.   Please read over the following fact sheets that you were given:   Surgical Site Infection Prevention

## 2016-02-18 ENCOUNTER — Inpatient Hospital Stay (HOSPITAL_COMMUNITY)
Admission: RE | Admit: 2016-02-18 | Discharge: 2016-02-21 | DRG: 765 | Disposition: A | Discharge status: Home / Self Care | Payer: Medicaid Other | Source: Ambulatory Visit | Attending: Family Medicine | Admitting: Family Medicine

## 2016-02-18 ENCOUNTER — Inpatient Hospital Stay (HOSPITAL_COMMUNITY): Payer: Medicaid Other | Admitting: Anesthesiology

## 2016-02-18 ENCOUNTER — Encounter (HOSPITAL_COMMUNITY)
Admission: RE | Disposition: A | Discharge status: Home / Self Care | Payer: Self-pay | Source: Ambulatory Visit | Attending: Family Medicine

## 2016-02-18 ENCOUNTER — Encounter (HOSPITAL_COMMUNITY): Payer: Self-pay | Admitting: Anesthesiology

## 2016-02-18 DIAGNOSIS — O34211 Maternal care for low transverse scar from previous cesarean delivery: Principal | ICD-10-CM | POA: Diagnosis present

## 2016-02-18 DIAGNOSIS — N83209 Unspecified ovarian cyst, unspecified side: Secondary | ICD-10-CM | POA: Diagnosis present

## 2016-02-18 DIAGNOSIS — D6959 Other secondary thrombocytopenia: Secondary | ICD-10-CM | POA: Diagnosis present

## 2016-02-18 DIAGNOSIS — Z3A39 39 weeks gestation of pregnancy: Secondary | ICD-10-CM

## 2016-02-18 DIAGNOSIS — R339 Retention of urine, unspecified: Secondary | ICD-10-CM | POA: Diagnosis not present

## 2016-02-18 DIAGNOSIS — Z789 Other specified health status: Secondary | ICD-10-CM | POA: Diagnosis present

## 2016-02-18 DIAGNOSIS — Z603 Acculturation difficulty: Secondary | ICD-10-CM | POA: Diagnosis present

## 2016-02-18 DIAGNOSIS — Z98891 History of uterine scar from previous surgery: Secondary | ICD-10-CM

## 2016-02-18 DIAGNOSIS — O09529 Supervision of elderly multigravida, unspecified trimester: Secondary | ICD-10-CM

## 2016-02-18 DIAGNOSIS — O34219 Maternal care for unspecified type scar from previous cesarean delivery: Secondary | ICD-10-CM | POA: Diagnosis present

## 2016-02-18 DIAGNOSIS — Z8632 Personal history of gestational diabetes: Secondary | ICD-10-CM

## 2016-02-18 DIAGNOSIS — O99344 Other mental disorders complicating childbirth: Secondary | ICD-10-CM | POA: Diagnosis present

## 2016-02-18 DIAGNOSIS — E2740 Unspecified adrenocortical insufficiency: Secondary | ICD-10-CM | POA: Diagnosis present

## 2016-02-18 DIAGNOSIS — Z3A38 38 weeks gestation of pregnancy: Secondary | ICD-10-CM | POA: Diagnosis not present

## 2016-02-18 DIAGNOSIS — O3480 Maternal care for other abnormalities of pelvic organs, unspecified trimester: Secondary | ICD-10-CM

## 2016-02-18 DIAGNOSIS — O3483 Maternal care for other abnormalities of pelvic organs, third trimester: Secondary | ICD-10-CM | POA: Diagnosis present

## 2016-02-18 DIAGNOSIS — K567 Ileus, unspecified: Secondary | ICD-10-CM

## 2016-02-18 DIAGNOSIS — O9902 Anemia complicating childbirth: Secondary | ICD-10-CM | POA: Diagnosis present

## 2016-02-18 DIAGNOSIS — O0990 Supervision of high risk pregnancy, unspecified, unspecified trimester: Secondary | ICD-10-CM

## 2016-02-18 DIAGNOSIS — O09299 Supervision of pregnancy with other poor reproductive or obstetric history, unspecified trimester: Secondary | ICD-10-CM

## 2016-02-18 DIAGNOSIS — O9912 Other diseases of the blood and blood-forming organs and certain disorders involving the immune mechanism complicating childbirth: Secondary | ICD-10-CM | POA: Diagnosis present

## 2016-02-18 DIAGNOSIS — D649 Anemia, unspecified: Secondary | ICD-10-CM | POA: Diagnosis present

## 2016-02-18 DIAGNOSIS — F329 Major depressive disorder, single episode, unspecified: Secondary | ICD-10-CM | POA: Diagnosis present

## 2016-02-18 DIAGNOSIS — O99019 Anemia complicating pregnancy, unspecified trimester: Secondary | ICD-10-CM | POA: Diagnosis present

## 2016-02-18 DIAGNOSIS — Z9889 Other specified postprocedural states: Secondary | ICD-10-CM

## 2016-02-18 DIAGNOSIS — R112 Nausea with vomiting, unspecified: Secondary | ICD-10-CM

## 2016-02-18 DIAGNOSIS — K9189 Other postprocedural complications and disorders of digestive system: Secondary | ICD-10-CM

## 2016-02-18 HISTORY — DX: History of uterine scar from previous surgery: Z98.891

## 2016-02-18 LAB — CBC
HCT: 36.1 % (ref 36.0–46.0)
Hemoglobin: 11.6 g/dL — ABNORMAL LOW (ref 12.0–15.0)
MCH: 26.7 pg (ref 26.0–34.0)
MCHC: 32.1 g/dL (ref 30.0–36.0)
MCV: 83 fL (ref 78.0–100.0)
PLATELETS: 81 10*3/uL — AB (ref 150–400)
RBC: 4.35 MIL/uL (ref 3.87–5.11)
RDW: 15.3 % (ref 11.5–15.5)
WBC: 6.6 10*3/uL (ref 4.0–10.5)

## 2016-02-18 LAB — PREPARE RBC (CROSSMATCH)

## 2016-02-18 LAB — RPR: RPR Ser Ql: NONREACTIVE

## 2016-02-18 SURGERY — Surgical Case
Anesthesia: Spinal | Site: Abdomen

## 2016-02-18 MED ORDER — SCOPOLAMINE 1 MG/3DAYS TD PT72: 1.0000 | MEDICATED_PATCH | Freq: Once | TRANSDERMAL | Status: DC

## 2016-02-18 MED ORDER — CHLOROPROCAINE HCL (PF) 3 % IJ SOLN
INTRAMUSCULAR | Status: AC
  Filled 2016-02-18: qty 20

## 2016-02-18 MED ORDER — SIMETHICONE 80 MG PO CHEW
80.0000 mg | CHEWABLE_TABLET | ORAL | Status: DC
  Administered 2016-02-19 – 2016-02-20 (×3): 80 mg via ORAL
  Filled 2016-02-18 (×3): qty 1

## 2016-02-18 MED ORDER — PHENYLEPHRINE 8 MG IN D5W 100 ML (0.08MG/ML) PREMIX OPTIME
INJECTION | INTRAVENOUS | Status: AC
  Filled 2016-02-18: qty 100

## 2016-02-18 MED ORDER — MORPHINE SULFATE (PF) 0.5 MG/ML IJ SOLN
INTRAMUSCULAR | Status: DC | PRN
  Administered 2016-02-18: .2 mg via INTRATHECAL

## 2016-02-18 MED ORDER — ONDANSETRON HCL 4 MG/2ML IJ SOLN: 4.0000 mg | Freq: Three times a day (TID) | INTRAMUSCULAR | Status: DC | PRN

## 2016-02-18 MED ORDER — HYDROMORPHONE HCL 1 MG/ML IJ SOLN
0.2500 mg | INTRAMUSCULAR | Status: DC | PRN
  Administered 2016-02-18 (×2): 0.25 mg via INTRAVENOUS

## 2016-02-18 MED ORDER — NALBUPHINE HCL 10 MG/ML IJ SOLN: 5.0000 mg | INTRAMUSCULAR | Status: DC | PRN

## 2016-02-18 MED ORDER — ONDANSETRON HCL 4 MG/2ML IJ SOLN: 4.0000 mg | Freq: Once | INTRAMUSCULAR | Status: DC | PRN

## 2016-02-18 MED ORDER — LACTATED RINGERS IV SOLN
INTRAVENOUS | Status: DC
  Administered 2016-02-19: 02:00:00 via INTRAVENOUS

## 2016-02-18 MED ORDER — LACTATED RINGERS IV SOLN
INTRAVENOUS | Status: DC
  Administered 2016-02-18 (×3): via INTRAVENOUS

## 2016-02-18 MED ORDER — OXYCODONE HCL 5 MG PO TABS
5.0000 mg | ORAL_TABLET | ORAL | Status: DC | PRN
  Filled 2016-02-18 (×2): qty 1

## 2016-02-18 MED ORDER — BUPIVACAINE HCL (PF) 0.25 % IJ SOLN
INTRAMUSCULAR | Status: AC
  Filled 2016-02-18: qty 30

## 2016-02-18 MED ORDER — SIMETHICONE 80 MG PO CHEW
80.0000 mg | CHEWABLE_TABLET | Freq: Three times a day (TID) | ORAL | Status: DC
  Administered 2016-02-18 – 2016-02-20 (×7): 80 mg via ORAL
  Filled 2016-02-18 (×6): qty 1

## 2016-02-18 MED ORDER — IBUPROFEN 600 MG PO TABS: 600.0000 mg | ORAL_TABLET | Freq: Four times a day (QID) | ORAL | Status: DC

## 2016-02-18 MED ORDER — PRENATAL MULTIVITAMIN CH
1.0000 | ORAL_TABLET | Freq: Every day | ORAL | Status: DC
  Administered 2016-02-19 – 2016-02-20 (×2): 1 via ORAL
  Filled 2016-02-18 (×2): qty 1

## 2016-02-18 MED ORDER — DIPHENHYDRAMINE HCL 50 MG/ML IJ SOLN: 12.5000 mg | INTRAMUSCULAR | Status: DC | PRN

## 2016-02-18 MED ORDER — WITCH HAZEL-GLYCERIN EX PADS: 1.0000 "application " | MEDICATED_PAD | CUTANEOUS | Status: DC | PRN

## 2016-02-18 MED ORDER — SIMETHICONE 80 MG PO CHEW
80.0000 mg | CHEWABLE_TABLET | ORAL | Status: DC | PRN
  Administered 2016-02-19: 80 mg via ORAL
  Filled 2016-02-18 (×2): qty 1

## 2016-02-18 MED ORDER — DIPHENHYDRAMINE HCL 25 MG PO CAPS
25.0000 mg | ORAL_CAPSULE | ORAL | Status: DC | PRN
  Filled 2016-02-18: qty 1

## 2016-02-18 MED ORDER — DIPHENHYDRAMINE HCL 25 MG PO CAPS: 25.0000 mg | ORAL_CAPSULE | Freq: Four times a day (QID) | ORAL | Status: DC | PRN

## 2016-02-18 MED ORDER — FENTANYL CITRATE (PF) 100 MCG/2ML IJ SOLN
INTRAMUSCULAR | Status: AC
  Filled 2016-02-18: qty 2

## 2016-02-18 MED ORDER — ONDANSETRON HCL 4 MG/2ML IJ SOLN
INTRAMUSCULAR | Status: DC | PRN
  Administered 2016-02-18: 4 mg via INTRAVENOUS

## 2016-02-18 MED ORDER — NALOXONE HCL 0.4 MG/ML IJ SOLN: 0.4000 mg | INTRAMUSCULAR | Status: DC | PRN

## 2016-02-18 MED ORDER — SCOPOLAMINE 1 MG/3DAYS TD PT72
1.0000 | MEDICATED_PATCH | Freq: Once | TRANSDERMAL | Status: DC
  Administered 2016-02-18: 1.5 mg via TRANSDERMAL

## 2016-02-18 MED ORDER — KETOROLAC TROMETHAMINE 30 MG/ML IJ SOLN: 30.0000 mg | Freq: Four times a day (QID) | INTRAMUSCULAR | Status: DC | PRN

## 2016-02-18 MED ORDER — FENTANYL CITRATE (PF) 100 MCG/2ML IJ SOLN
INTRAMUSCULAR | Status: DC | PRN
  Administered 2016-02-18: 30 ug via INTRAVENOUS
  Administered 2016-02-18: 20 ug via INTRATHECAL

## 2016-02-18 MED ORDER — SCOPOLAMINE 1 MG/3DAYS TD PT72
MEDICATED_PATCH | TRANSDERMAL | Status: AC
  Administered 2016-02-18: 1.5 mg via TRANSDERMAL
  Filled 2016-02-18: qty 1

## 2016-02-18 MED ORDER — DIBUCAINE 1 % RE OINT: 1.0000 "application " | TOPICAL_OINTMENT | RECTAL | Status: DC | PRN

## 2016-02-18 MED ORDER — KETOROLAC TROMETHAMINE 30 MG/ML IJ SOLN: 30.0000 mg | Freq: Four times a day (QID) | INTRAMUSCULAR | Status: DC

## 2016-02-18 MED ORDER — CEFAZOLIN SODIUM-DEXTROSE 2-4 GM/100ML-% IV SOLN
2.0000 g | INTRAVENOUS | Status: AC
  Administered 2016-02-18: 2 g via INTRAVENOUS

## 2016-02-18 MED ORDER — LACTATED RINGERS IV SOLN
40.0000 [IU] | INTRAVENOUS | Status: DC | PRN
  Administered 2016-02-18: 40 [IU] via INTRAVENOUS

## 2016-02-18 MED ORDER — NALBUPHINE HCL 10 MG/ML IJ SOLN: 5.0000 mg | Freq: Once | INTRAMUSCULAR | Status: DC | PRN

## 2016-02-18 MED ORDER — MEPERIDINE HCL 25 MG/ML IJ SOLN: 6.2500 mg | INTRAMUSCULAR | Status: DC | PRN

## 2016-02-18 MED ORDER — LACTATED RINGERS IV SOLN
Freq: Once | INTRAVENOUS | Status: AC
  Administered 2016-02-18: 12:00:00 via INTRAVENOUS

## 2016-02-18 MED ORDER — ACETAMINOPHEN 500 MG PO TABS: 1000.0000 mg | ORAL_TABLET | Freq: Four times a day (QID) | ORAL | Status: DC

## 2016-02-18 MED ORDER — PHENYLEPHRINE 8 MG IN D5W 100 ML (0.08MG/ML) PREMIX OPTIME
INJECTION | INTRAVENOUS | Status: DC | PRN
  Administered 2016-02-18: 60 ug/min via INTRAVENOUS

## 2016-02-18 MED ORDER — NALBUPHINE HCL 10 MG/ML IJ SOLN
INTRAMUSCULAR | Status: AC
  Filled 2016-02-18: qty 1

## 2016-02-18 MED ORDER — SODIUM CHLORIDE 0.9 % IR SOLN
Status: DC | PRN
  Administered 2016-02-18: 1

## 2016-02-18 MED ORDER — HYDROMORPHONE HCL 1 MG/ML IJ SOLN
INTRAMUSCULAR | Status: AC
  Filled 2016-02-18: qty 1

## 2016-02-18 MED ORDER — NALBUPHINE HCL 10 MG/ML IJ SOLN
5.0000 mg | INTRAMUSCULAR | Status: DC | PRN
  Administered 2016-02-18: 5 mg via SUBCUTANEOUS

## 2016-02-18 MED ORDER — SENNOSIDES-DOCUSATE SODIUM 8.6-50 MG PO TABS
2.0000 | ORAL_TABLET | ORAL | Status: DC
  Administered 2016-02-19 (×2): 2 via ORAL
  Filled 2016-02-18 (×4): qty 2

## 2016-02-18 MED ORDER — OXYCODONE HCL 5 MG PO TABS
10.0000 mg | ORAL_TABLET | ORAL | Status: DC | PRN
  Administered 2016-02-19 – 2016-02-20 (×6): 10 mg via ORAL
  Filled 2016-02-18 (×7): qty 2

## 2016-02-18 MED ORDER — COCONUT OIL OIL
1.0000 "application " | TOPICAL_OIL | Status: DC | PRN
  Administered 2016-02-19: 1 via TOPICAL
  Filled 2016-02-18: qty 120

## 2016-02-18 MED ORDER — MORPHINE SULFATE (PF) 0.5 MG/ML IJ SOLN
INTRAMUSCULAR | Status: AC
  Filled 2016-02-18: qty 10

## 2016-02-18 MED ORDER — SODIUM CHLORIDE 0.9% FLUSH: 3.0000 mL | INTRAVENOUS | Status: DC | PRN

## 2016-02-18 MED ORDER — BUPIVACAINE IN DEXTROSE 0.75-8.25 % IT SOLN
INTRATHECAL | Status: DC | PRN
  Administered 2016-02-18: 1.5 mL via INTRATHECAL

## 2016-02-18 MED ORDER — NALOXONE HCL 2 MG/2ML IJ SOSY: 1.0000 ug/kg/h | PREFILLED_SYRINGE | INTRAVENOUS | Status: DC | PRN

## 2016-02-18 MED ORDER — OXYTOCIN 40 UNITS IN LACTATED RINGERS INFUSION - SIMPLE MED: 2.5000 [IU]/h | INTRAVENOUS | Status: AC

## 2016-02-18 MED ORDER — ONDANSETRON HCL 4 MG/2ML IJ SOLN
INTRAMUSCULAR | Status: AC
  Filled 2016-02-18: qty 2

## 2016-02-18 MED ORDER — IBUPROFEN 600 MG PO TABS
600.0000 mg | ORAL_TABLET | Freq: Four times a day (QID) | ORAL | Status: DC | PRN
Start: 2016-02-18 — End: 2016-02-18

## 2016-02-18 MED ORDER — BUPIVACAINE HCL (PF) 0.25 % IJ SOLN
INTRAMUSCULAR | Status: DC | PRN
  Administered 2016-02-18: 30 mL

## 2016-02-18 MED ORDER — OXYTOCIN 10 UNIT/ML IJ SOLN
INTRAMUSCULAR | Status: AC
  Filled 2016-02-18: qty 4

## 2016-02-18 MED ORDER — ACETAMINOPHEN 325 MG PO TABS
650.0000 mg | ORAL_TABLET | ORAL | Status: DC | PRN
  Administered 2016-02-19 – 2016-02-21 (×5): 650 mg via ORAL
  Filled 2016-02-18 (×7): qty 2

## 2016-02-18 MED ORDER — ZOLPIDEM TARTRATE 5 MG PO TABS: 5.0000 mg | ORAL_TABLET | Freq: Every evening | ORAL | Status: DC | PRN

## 2016-02-18 MED ORDER — TETANUS-DIPHTH-ACELL PERTUSSIS 5-2.5-18.5 LF-MCG/0.5 IM SUSP: 0.5000 mL | Freq: Once | INTRAMUSCULAR | Status: DC

## 2016-02-18 MED ORDER — MENTHOL 3 MG MT LOZG: 1.0000 | LOZENGE | OROMUCOSAL | Status: DC | PRN

## 2016-02-18 SURGICAL SUPPLY — 33 items
BENZOIN TINCTURE PRP APPL 2/3 (GAUZE/BANDAGES/DRESSINGS) ×3 IMPLANT
CLAMP CORD UMBIL (MISCELLANEOUS) IMPLANT
CLOSURE WOUND 1/2 X4 (GAUZE/BANDAGES/DRESSINGS) ×1
CLOTH BEACON ORANGE TIMEOUT ST (SAFETY) ×3 IMPLANT
DECANTER SPIKE VIAL GLASS SM (MISCELLANEOUS) ×3 IMPLANT
DRSG OPSITE POSTOP 4X10 (GAUZE/BANDAGES/DRESSINGS) ×3 IMPLANT
DURAPREP 26ML APPLICATOR (WOUND CARE) ×3 IMPLANT
ELECT REM PT RETURN 9FT ADLT (ELECTROSURGICAL) ×3
ELECTRODE REM PT RTRN 9FT ADLT (ELECTROSURGICAL) ×1 IMPLANT
EXTRACTOR VACUUM M CUP 4 TUBE (SUCTIONS) IMPLANT
EXTRACTOR VACUUM M CUP 4' TUBE (SUCTIONS)
GLOVE BIOGEL PI IND STRL 7.0 (GLOVE) ×2 IMPLANT
GLOVE BIOGEL PI INDICATOR 7.0 (GLOVE) ×4
GLOVE ECLIPSE 7.0 STRL STRAW (GLOVE) ×6 IMPLANT
GOWN STRL REUS W/TWL LRG LVL3 (GOWN DISPOSABLE) ×6 IMPLANT
KIT ABG SYR 3ML LUER SLIP (SYRINGE) IMPLANT
NEEDLE HYPO 22GX1.5 SAFETY (NEEDLE) ×3 IMPLANT
NEEDLE HYPO 25X5/8 SAFETYGLIDE (NEEDLE) IMPLANT
NS IRRIG 1000ML POUR BTL (IV SOLUTION) ×3 IMPLANT
PACK C SECTION WH (CUSTOM PROCEDURE TRAY) ×3 IMPLANT
PAD ABD 7.5X8 STRL (GAUZE/BANDAGES/DRESSINGS) ×3 IMPLANT
PAD OB MATERNITY 4.3X12.25 (PERSONAL CARE ITEMS) ×3 IMPLANT
PENCIL SMOKE EVAC W/HOLSTER (ELECTROSURGICAL) ×3 IMPLANT
RTRCTR C-SECT PINK 25CM LRG (MISCELLANEOUS) ×3 IMPLANT
SPONGE GAUZE 4X4 12PLY STER LF (GAUZE/BANDAGES/DRESSINGS) ×6 IMPLANT
STRIP CLOSURE SKIN 1/2X4 (GAUZE/BANDAGES/DRESSINGS) ×2 IMPLANT
SUT VIC AB 0 CT1 36 (SUTURE) ×3 IMPLANT
SUT VIC AB 0 CTX 36 (SUTURE) ×6
SUT VIC AB 0 CTX36XBRD ANBCTRL (SUTURE) ×3 IMPLANT
SUT VIC AB 4-0 KS 27 (SUTURE) ×3 IMPLANT
SYR 30ML LL (SYRINGE) ×3 IMPLANT
TOWEL OR 17X24 6PK STRL BLUE (TOWEL DISPOSABLE) ×3 IMPLANT
TRAY FOLEY CATH SILVER 14FR (SET/KITS/TRAYS/PACK) ×3 IMPLANT

## 2016-02-18 NOTE — Lactation Note (Signed)
This note was copied from a baby's chart. Lactation Consultation Note  Patient Name: Lindsey Molina [name] ZOXWR'UToday's Date: 02/18/2016 Reason for consult: Follow-up assessment Baby at 9 hr of life and FOB is asking for bottles. Mom can easily express large drops of colostrum bilaterally. Baby is latching well, visible swallows. Baby is above expected voids. Risks of formula discussed.    Maternal Data    Feeding Feeding Type: Breast Fed  LATCH Score/Interventions Latch: Grasps breast easily, tongue down, lips flanged, rhythmical sucking.  Audible Swallowing: Spontaneous and intermittent Intervention(s): Skin to skin Intervention(s): Alternate breast massage  Type of Nipple: Everted at rest and after stimulation  Comfort (Breast/Nipple): Filling, red/small blisters or bruises, mild/mod discomfort  Problem noted: Mild/Moderate discomfort Interventions (Mild/moderate discomfort): Hand expression  Hold (Positioning): Assistance needed to correctly position infant at breast and maintain latch. Intervention(s): Position options;Support Pillows  LATCH Score: 8  Lactation Tools Discussed/Used     Consult Status Consult Status: Follow-up Date: 02/19/16 Follow-up type: In-patient    Rulon Eisenmengerlizabeth E Aleksi Brummet 02/18/2016, 10:31 PM

## 2016-02-18 NOTE — Anesthesia Postprocedure Evaluation (Signed)
Anesthesia Post Note  Patient: Lindsey Molina  Procedure(s) Performed: Procedure(s) (LRB): REPEAT CESAREAN SECTION (N/A)  Patient location during evaluation: PACU Anesthesia Type: Spinal Level of consciousness: awake Pain management: pain level controlled Vital Signs Assessment: post-procedure vital signs reviewed and stable Respiratory status: spontaneous breathing Cardiovascular status: stable Postop Assessment: no headache, no backache, spinal receding, patient able to bend at knees and no signs of nausea or vomiting Anesthetic complications: no     Last Vitals:  Filed Vitals:   02/18/16 1440 02/18/16 1445  BP:  107/71  Pulse: 66 61  Temp:    Resp: 18 15    Last Pain:  Filed Vitals:   02/18/16 1447  PainSc: 0-No pain   Pain Goal:                 Cerina Leary JR,JOHN Estephani Popper

## 2016-02-18 NOTE — Addendum Note (Signed)
Addendum  created 02/18/16 1702 by Elgie CongoNataliya H Kisean Rollo, CRNA   Modules edited: Clinical Notes   Clinical Notes:  File: [id number]

## 2016-02-18 NOTE — Transfer of Care (Signed)
Immediate Anesthesia Transfer of Care Note  Patient: Lindsey Molina  Procedure(s) Performed: Procedure(s): REPEAT CESAREAN SECTION (N/A)  Patient Location: PACU  Anesthesia Type:Spinal  Level of Consciousness: awake, alert , oriented and patient cooperative  Airway & Oxygen Therapy: Patient Spontanous Breathing  Post-op Assessment: Report given to RN and Post -op Vital signs reviewed and stable  Post vital signs: Reviewed and stable  Last Vitals: There were no vitals filed for this visit.  Last Pain: There were no vitals filed for this visit.       Complications: No apparent anesthesia complications

## 2016-02-18 NOTE — Interval H&P Note (Signed)
History and Physical Interval Note:  02/18/2016 11:59 AM  Lindsey Molina  has presented today for surgery, with the diagnosis of  REPEAT c-section  The various methods of treatment have been discussed with the patient and family. After consideration of risks, benefits and other options for treatment, the patient has consented to  Procedure(s): REPEAT CESAREAN SECTION (N/A) as a surgical intervention .  The patient's history has been reviewed, patient examined, no change in status, stable for surgery.  I have reviewed the patient's chart and labs.  Questions were answered to the patient's satisfaction.     Jennelle Pinkstaff S

## 2016-02-18 NOTE — Op Note (Signed)
Cesarean Section Operative Report  Lindsey Molina  02/18/2016  Indications: Scheduled Proceedure/Maternal Request   Pre-operative Diagnosis:  REPEAT c-section.   Post-operative Diagnosis: Same   Surgeon: Surgeon(s) and Role:    * Reva Boresanya S Pratt, MD - Primary    * Kathrynn RunningNoah Bedford Demontez Novack, MD - Assisting   Attending Attestation: I was present and scrubbed for the entire procedure.   Assistants: none  Anesthesia: spinal    Estimated Blood Loss: 600 ml  Total IV Fluids: 2200 ml LR  Urine Output:: 400 ml clear yellow urine  Specimens: none  Findings: Viable female infant in cephalic presentation; Apgars 9/9; weight 3290 g; arterial cord pH not obtained; clear amniotic fluid; intact placenta with three vessel cord; normal uterus, fallopian tubes and ovaries bilaterally. No significant adhesive disease.  Baby condition / location:  Couplet care / Skin to Skin   Complications: no complications  Indications: Lindsey SmallMina Molina is a 40 y.o. [id number] with an IUP 3374w0d presenting for scheduled repeat c/s.Marland Kitchen.  The risks, benefits, complications, treatment options, and expected outcomes were discussed with the patient . The patient concurred with the proposed plan, giving informed consent. identified as Lindsey Molina and the procedure verified as C-Section Delivery.  Procedure Details:  The patient was taken back to the operative suite where spinal anesthesia was placed.  A time out was held and the above information confirmed.   After induction of anesthesia, the patient was draped and prepped in the usual sterile manner and placed in a dorsal supine position with a leftward tilt. A Pfannenstiel incision was made and carried down through the subcutaneous tissue to the fascia. Fascial incision was made and sharply extended transversely. The fascia was separated from the underlying rectus tissue superiorly and inferiorly. The peritoneum was identified and sharply entered and extended  longitudinally. Alexis retractor was placed. Very thin lower uterine segment noted. A low transverse uterine incision was made and extended bluntly. Delivered from cephalic presentation was a viable infant with Apgars and weight as above. The umbilical cord was clamped and cut cord blood was obtained for evaluation. Cord ph was not sent. The placenta was removed Intact and appeared normal. The uterine incision was closed with running locked sutures of 0Vicryl in a single layer.  Hemostasis was observed after placement of one figure-of-eight 0 vicryl stitch over the hysterotomy. A portion of omentum was adhered to the parietal peritoneum. This was transected and suture ligated with 0 vicryl. The peritoneum was closed with 0 vicryl. The rectus muscles were examined and hemostasis observed. The fascia was then reapproximated with running sutures of 0Vicryl. A total of 30 ml 0.25% Marcaine was injected subcutaneously at the margins of the incision. No subcuticular closure. The skin was closed with 0Vicryl.   Instrument, sponge, and needle counts were correct prior the abdominal closure and were correct at the conclusion of the case.     Disposition: PACU - hemodynamically stable.   Maternal Condition: stable       Signed: Lavonne Chickoah B Molina 02/18/2016 1:38 PM

## 2016-02-18 NOTE — Anesthesia Procedure Notes (Signed)
Spinal Patient location during procedure: OR Start time: 02/18/2016 12:15 PM End time: 02/18/2016 12:17 PM Staffing Anesthesiologist: Leilani AbleHATCHETT, Rhegan Trunnell Preanesthetic Checklist Completed: patient identified, surgical consent, pre-op evaluation, timeout performed, IV checked, risks and benefits discussed and monitors and equipment checked Spinal Block Patient position: sitting Prep: site prepped and draped and DuraPrep Patient monitoring: heart rate, cardiac monitor, continuous pulse ox and blood pressure Approach: midline Location: L3-4 Injection technique: single-shot Needle Needle type: Sprotte  Needle gauge: 24 G Needle length: 9 cm Needle insertion depth: 6 cm Assessment Sensory level: T4

## 2016-02-18 NOTE — Anesthesia Postprocedure Evaluation (Signed)
Anesthesia Post Note  Patient: PharmacologistMina Laabs  Procedure(s) Performed: Procedure(s) (LRB): REPEAT CESAREAN SECTION (N/A)  Patient location during evaluation: Mother Baby Anesthesia Type: Spinal Level of consciousness: awake and alert and oriented Pain management: pain level controlled Vital Signs Assessment: post-procedure vital signs reviewed and stable Respiratory status: spontaneous breathing and nonlabored ventilation Cardiovascular status: stable Postop Assessment: spinal receding, patient able to bend at knees, adequate PO intake and no signs of nausea or vomiting Anesthetic complications: no     Last Vitals:  Filed Vitals:   02/18/16 1511 02/18/16 1601  BP: 103/58 115/66  Pulse: 65 68  Temp: 36.5 C 36.4 C  Resp: 20 20    Last Pain:  Filed Vitals:   02/18/16 1623  PainSc: 1    Pain Goal: Patients Stated Pain Goal: 1 (02/18/16 1601)               Duchess Armendarez Hristova

## 2016-02-18 NOTE — Lactation Note (Signed)
This note was copied from a baby's chart. Lactation Consultation Note  Patient Name: Lindsey Molina ZHYQM'VToday's Date: 02/18/2016 Reason for consult: Initial assessment Baby at 5 hr of life. Mom is getting out of bed for the 1st time with RN. RN reports baby has been feeding well. Left handouts and instructions to call lactation at next feeding.   Maternal Data    Feeding Feeding Type: Breast Fed Length of feed: 20 min  LATCH Score/Interventions Latch: Grasps breast easily, tongue down, lips flanged, rhythmical sucking.  Audible Swallowing: None Intervention(s): Skin to skin;Hand expression Intervention(s): Skin to skin;Hand expression  Type of Nipple: Everted at rest and after stimulation  Comfort (Breast/Nipple): Soft / non-tender     Hold (Positioning): No assistance needed to correctly position infant at breast. Intervention(s): Breastfeeding basics reviewed;Support Pillows;Position options;Skin to skin  LATCH Score: 8  Lactation Tools Discussed/Used     Consult Status Consult Status: Follow-up Date: 02/18/16 Follow-up type: In-patient    Rulon Eisenmengerlizabeth E Griselle Rufer 02/18/2016, 6:44 PM

## 2016-02-18 NOTE — Anesthesia Preprocedure Evaluation (Signed)
Anesthesia Evaluation  Patient identified by MRN, date of birth, ID band Patient awake    Reviewed: Allergy & Precautions, H&P , NPO status , Patient's Chart, lab work & pertinent test results  Airway Mallampati: II  TM Distance: >3 FB Neck ROM: full    Dental no notable dental hx.    Pulmonary    Pulmonary exam normal        Cardiovascular negative cardio ROS Normal cardiovascular exam     Neuro/Psych negative neurological ROS     GI/Hepatic negative GI ROS, Neg liver ROS,   Endo/Other  negative endocrine ROS  Renal/GU negative Renal ROS     Musculoskeletal   Abdominal (+) + obese,   Peds  Hematology negative hematology ROS (+)   Anesthesia Other Findings   Reproductive/Obstetrics (+) Pregnancy                             Anesthesia Physical Anesthesia Plan  ASA: II  Anesthesia Plan: Spinal   Post-op Pain Management:    Induction:   Airway Management Planned:   Additional Equipment:   Intra-op Plan:   Post-operative Plan:   Informed Consent: I have reviewed the patients History and Physical, chart, labs and discussed the procedure including the risks, benefits and alternatives for the proposed anesthesia with the patient or authorized representative who has indicated his/her understanding and acceptance.     Plan Discussed with: CRNA and Surgeon  Anesthesia Plan Comments:         Anesthesia Quick Evaluation

## 2016-02-19 LAB — CBC
HCT: 35.7 % — ABNORMAL LOW (ref 36.0–46.0)
Hemoglobin: 12 g/dL (ref 12.0–15.0)
MCH: 27 pg (ref 26.0–34.0)
MCHC: 33.6 g/dL (ref 30.0–36.0)
MCV: 80.4 fL (ref 78.0–100.0)
PLATELETS: 78 10*3/uL — AB (ref 150–400)
RBC: 4.44 MIL/uL (ref 3.87–5.11)
RDW: 15.4 % (ref 11.5–15.5)
WBC: 7.1 10*3/uL (ref 4.0–10.5)

## 2016-02-19 NOTE — Progress Notes (Signed)
Pt up to bathroom for peri-care and to d/c foley. Pt refusing pain medication at this time. Did well up to bathroom until up from toilet to return to bed. C/o lightheadedness but refused for RN to use assist device to get her back to bed. Pt walked to bed with RN and FOB assist but RN visibly able to see pt felt faint. Able to get patient into bed. BP reassessed 118/77, HR 72. Pt and FOB instructed for pt not to get up to bathroom without staff assistance. IV maintenance fluids running.

## 2016-02-19 NOTE — Lactation Note (Signed)
This note was copied from a baby's chart. Lactation Consultation Note  Patient Name: Girl Maurice SmallMina Dickenson WUJWJ'XToday's Date: 02/19/2016   Visited with Mom and FOB, baby 7325 hrs old.  Baby asleep in crib, Mom resting in bed.  Introduced myself and asked Mom if she had any questions or need for assistance with anything.  Mom turned on her side and closed her eyes.  FOB said Mom stated she had just breast fed baby for 10 minutes.  It looks as if baby cluster fed throughout the night, and Mom is exhausted.  Baby has gotten 3 bottles of formula this morning per Mom's choice.  Reminded Mom that baby should be feeding >8 times in 24 hrs, and that she can call for assistance at anytime.  To follow up in am.   Judee ClaraSmith, Elizah Mierzwa E 02/19/2016, 2:42 PM

## 2016-02-19 NOTE — Progress Notes (Signed)
CSW received consult for hx of depression. CSW completed chart review and notes no mental health concerns noted during prenatal course. Due to limited staffing, CSW is unable to meet with MOB at this time. Please call CSW if acute concerns are noted. 

## 2016-02-19 NOTE — Progress Notes (Signed)
Post Partum Day 1, POD1 Subjective:  Lindsey Molina is a 40 y.o. G3P3003 6070w0d s/p RLTCS. Her pregnancy was complicated by gestational thrombocytopenia. No acute events overnight.  Pt denies problems with ambulating, voiding or po intake.  She denies nausea or vomiting. Patient has mild pain but she refused pain medications. She has not had flatus.  Lochia Minimal.  Plan for birth control is IUD.  Method of Feeding: breastfeeding.  Patient's husband reported that she felt dizzy when walking to the restroom this morning. He believes that it was caused by her not getting much sleep last night.   Objective: Blood pressure 118/77, pulse 72, temperature 98.3 F (36.8 C), temperature source Oral, resp. rate 18, SpO2 94 %, unknown if currently breastfeeding.  Physical Exam:  Deferred. Patient was sleeping, and husband did not want males examining patient due to cultural reasons.   Recent Labs  02/18/16 1108 02/19/16 0506  HGB 11.6* 12.0  HCT 36.1 35.7*  Platelets 82 (5/22) -> 81 (5/23) -> 78 (5/24)  Assessment/Plan:  ASSESSMENT: Lindsey Molina is a 40 y.o. G3P3003 3070w0d s/p RLTCS.   #Gestational Thrombocytopenia: Platelet levels remain low but BP has been in normal ranges, and hemoglobin has been stable. This is likely gestational thrombocytopenia and not preeclampsia or DIC.  -repeat CBC at outpatient follow up in 6 weeks  #Postpartum care Plan for discharge tomorrow Continue routine PP care Breastfeeding support PRN  Vanice SarahPeter Sheng, medical student  OB fellow attestation Post Partum Day /POD#1 I have seen and examined this patient and agree with above documentation in the medical student's note.   Lindsey Molina is a 40 y.o. [id number] s/p rLTCS.  Pt denies problems with ambulating, voiding or po intake. Pain ispoorly controlled, due for medication.  Plan for birth control is IUD.  Method of Feeding: breast  PE:  BP 118/73 mmHg  Pulse 90  Temp(Src) 98.6 F (37 C) (Oral)  Resp  18  SpO2 99%  Breastfeeding? Unknown Gen: well appearing Heart: reg rate Lungs: normal WOB Fundus firm Ext: soft, no pain, no edema  Plan for discharge: POD2 or 3 -Routine care  -Pain med support  #gestational thrombocytopenia - reapt cbc at 6 wks Federico FlakeKimberly Niles Caidynce Muzyka, MD 11:35 AM

## 2016-02-20 ENCOUNTER — Inpatient Hospital Stay (HOSPITAL_COMMUNITY): Payer: Medicaid Other

## 2016-02-20 LAB — BASIC METABOLIC PANEL
Anion gap: 9 (ref 5–15)
BUN: 8 mg/dL (ref 6–20)
CHLORIDE: 102 mmol/L (ref 101–111)
CO2: 24 mmol/L (ref 22–32)
CREATININE: 0.39 mg/dL — AB (ref 0.44–1.00)
Calcium: 8.1 mg/dL — ABNORMAL LOW (ref 8.9–10.3)
GFR calc Af Amer: >60 mL/min (ref >60)
GFR calc non Af Amer: >60 mL/min (ref >60)
GLUCOSE: 102 mg/dL — AB (ref 65–99)
Potassium: 3.5 mmol/L (ref 3.5–5.1)
SODIUM: 135 mmol/L (ref 135–145)

## 2016-02-20 MED ORDER — GLYCERIN (LAXATIVE) 2.1 G RE SUPP
1.0000 | Freq: Once | RECTAL | Status: AC
  Administered 2016-02-20: 1 via RECTAL
  Filled 2016-02-20: qty 1

## 2016-02-20 MED ORDER — LACTATED RINGERS IV SOLN
INTRAVENOUS | Status: DC
  Filled 2016-02-20: qty 1000

## 2016-02-20 MED ORDER — MORPHINE SULFATE (PF) 4 MG/ML IV SOLN
2.0000 mg | INTRAVENOUS | Status: DC | PRN
  Administered 2016-02-20 – 2016-02-21 (×4): 2 mg via INTRAVENOUS
  Filled 2016-02-20 (×4): qty 1

## 2016-02-20 MED ORDER — LACTATED RINGERS IV SOLN
INTRAVENOUS | Status: DC
  Administered 2016-02-20: 22:00:00 via INTRAVENOUS

## 2016-02-20 NOTE — Progress Notes (Signed)
Contacted concerning KUB results, which showed severe colonic ileus measuring 12.2cm. Noted some nausea. Denies vomiting. Continues to note abdominal pain. Exam with diffuse abdominal tenderness and distention. Will attempt conservative management.  Will make NPO and initiate LR@125cc /hr. Discussed NPO status with patient and agreed to not take anything orally. If no improvement noted, consider NG tube placement.  Dr. Caroleen Hammanumley 02/20/16, 8:42 PM

## 2016-02-20 NOTE — Progress Notes (Signed)
Post Partum Day #2 Subjective:  Lindsey Molina is a 40 y.o. [id number] 6277w0d s/p elective repeat c-section.  No acute events overnight.  Pt denies problems with ambulating, voiding or po intake, however nursing reports she has not ambulated much.  She reports some nausea, but denies vomiting; states nausea is improved this morning.  Pain is poorly controlled.  She has not had flatus. She has not had bowel movement.  Lochia Minimal.  Plan for birth control is IUD.  Method of Feeding: Breast.  Objective: Blood pressure 118/73, pulse 90, temperature 98.6 F (37 C), temperature source Oral, resp. rate 18, SpO2 99 %, unknown if currently breastfeeding.  Physical Exam:  General: alert, cooperative and no distress Lochia:normal flow Chest: CTAB Heart: RRR no m/r/g Abdomen: +BS, soft, nontender,  Uterine Fundus: firm DVT Evaluation: No evidence of DVT seen on physical exam. Extremities: No edema   Recent Labs  02/18/16 1108 02/19/16 0506  HGB 11.6* 12.0  HCT 36.1 35.7*    Assessment/Plan:  ASSESSMENT: Lindsey Molina is a 40 y.o. [id number] 1977w0d s/p elective repeat c-section  Possible discharge later today vs. tomorrow; evaluate this afternoon to determine. Continue Perocet PRN pain. Continue Senna PRN constipation. Consider suppository. Continue to work on ambulation.    LOS: 2 days   [facility] Rumley 02/20/2016, 7:57 AM    CNM attestation Post Partum Day #2 I have seen and examined this patient and agree with above documentation in the resident's note.   Lindsey Molina is a 40 y.o. Q4O9629G3P3003 s/p rLTCS.  Pt reports problems with ambulating per nursing; no problems voiding or with po intake. Pain is well controlled.  Plan for birth control is IUD.  Method of Feeding: breast  PE:  BP 126/78 mmHg  Pulse 79  Temp(Src) 98.4 F (36.9 C) (Oral)  Resp 18  SpO2 99%  Breastfeeding? Unknown Fundus firm  Plan for discharge: either later today, or tomorrow 02/21/16.  May try a  suppository if abd discomfort persists.  Cam HaiSHAW, KIMBERLY, CNM 12:31 AM

## 2016-02-20 NOTE — Discharge Summary (Signed)
OB Discharge Summary     Patient Name: Lindsey SmallMina Capri DOB: 01/04/1976 MRN: [id number]  Date of admission: 02/18/2016 Delivering MD: Reva BoresPRATT, TANYA S   Date of discharge: 02/21/2016  Admitting diagnosis: cpt [phone] - REPEAT c-section   Intrauterine pregnancy: 8972w0d     Secondary diagnosis:  Principal Problem:   Status post repeat low transverse cesarean section Active Problems:   Hypoadrenalism (HCC)   Anemia of mother in pregnancy, antepartum   Ovarian cyst in pregnancy   Supervision of high risk pregnancy, antepartum   Previous cesarean delivery, antepartum   Language barrier affecting health care   H/O gestational diabetes in prior pregnancy, currently pregnant   Advanced maternal age in multigravida   Ileus, postoperative  Additional problems: none     Discharge diagnosis: Term Pregnancy Delivered                                                                                                Post partum procedures:none  Augmentation: na  Complications:Postoperative ileus  Hospital course:  Sceduled C/S   40 y.o. yo G3P3003 at 2572w0d was admitted to the hospital 02/18/2016 for scheduled cesarean section with the following indication:Elective Repeat.  Membrane Rupture Time/Date: 12:53 PM ,02/18/2016   Patient delivered a Viable infant.02/18/2016  Details of operation can be found in separate operative note.  Pateint had a postpartum course complicated by postoperative ileus with 12cm of intestinal dilation. On POD#2 and #3 patient has nausea with no vomiting, she began ambulating and passed gas. She was evaluated multiple times and her abdominal exam improved throughout POD#3 and she strongly desired discharge. She was able to tolerate crackers well and the decision was made to proceed with discharge.  She is ambulating, tolerating a regular diet, passing flatus, and urinating well. Patient is discharged home in stable condition on  02/26/2016         Physical exam  Filed  Vitals:   02/20/16 1846 02/20/16 2040 02/21/16 0628 02/21/16 1815  BP: 116/72 122/79 130/77 126/78  Pulse: 87 86 74 79  Temp: 98.4 F (36.9 C) 98.4 F (36.9 C) 98.3 F (36.8 C) 98.4 F (36.9 C)  TempSrc: Oral Oral Oral Oral  Resp: 18 18 20 18   SpO2:       General: alert, cooperative and no distress Lochia: appropriate Uterine Fundus: firm Incision: Healing well with no significant drainage, No significant erythema, Dressing is clean, dry, and intact DVT Evaluation: No evidence of DVT seen on physical exam. Negative Homan's sign. Labs: Lab Results  Component Value Date   WBC 7.1 02/19/2016   HGB 12.0 02/19/2016   HCT 35.7* 02/19/2016   MCV 80.4 02/19/2016   PLT 78* 02/19/2016   CMP Latest Ref Rng 02/20/2016  Glucose 65 - 99 mg/dL 295(A102(H)  BUN 6 - 20 mg/dL 8  Creatinine 2.130.44 - 0.861.00 mg/dL 5.78(I0.39(L)  Sodium 696135 - 295145 mmol/L 135  Potassium 3.5 - 5.1 mmol/L 3.5  Chloride 101 - 111 mmol/L 102  CO2 22 - 32 mmol/L 24  Calcium 8.9 - 10.3 mg/dL 8.1(L)  Total Protein 6.5 - 8.1 g/dL -  Total Bilirubin 0.3 - 1.2 mg/dL -  Alkaline Phos 38 - 409 U/L -  AST 15 - 41 U/L -  ALT 14 - 54 U/L -    Discharge instruction: per After Visit Summary and "Baby and Me Booklet".  After visit meds:    Medication List    STOP taking these medications        folic acid 1 MG tablet  Commonly known as:  FOLVITE     hydrocortisone 10 MG tablet  Commonly known as:  CORTEF     metoCLOPramide 10 MG tablet  Commonly known as:  REGLAN      TAKE these medications        oxyCODONE-acetaminophen 5-325 MG tablet  Commonly known as:  PERCOCET/ROXICET  Take 1-2 tablets by mouth every 6 (six) hours as needed.     PRENATAL VITAMINS PLUS 27-1 MG Tabs  Take 1 tablet by mouth daily.        Diet: routine diet  Activity: Advance as tolerated. Pelvic rest for 6 weeks.   Outpatient follow up:6 weeks Follow up Appt:Future Appointments Date Time Provider Department Center  03/25/2016 8:20 AM Judeth Horn, NP [facility]   Follow up Visit:No Follow-up on file.  Postpartum contraception: IUD-LNG  Newborn Data: Live born female  Birth Weight: 7 lb 4.1 oz (3290 g) APGAR: 9, 9  Baby Feeding: Breast Disposition:home with mother  02/26/2016 Federico Flake, MD

## 2016-02-20 NOTE — Lactation Note (Signed)
This note was copied from a baby's chart. Lactation Consultation Note  Patient Name: Girl Maurice SmallMina Grennan ZOXWR'UToday's Date: 02/20/2016 Reason for consult: Follow-up assessment   Follow up with mom of 50 hour old infant. Infant with 3 BF for 10-15 minutes, 7 bottle feeds of 5-60 cc, 4 voids and 2 stools in last 24 hours. LATCH Scores 9 by bedside RN. Mom was asleep on the couch and infant was in a swaddler and asleep in crib. Spoke with father and asked if there are any questions about BF. He reported "She has no milk" . Reviewed supply and demand with dad and encouraged hom to ask mom to place infant to breast with each feeding before giving formula. Dad said ok. Enc dad to call for BF assistance as needed.    Maternal Data    Feeding Feeding Type: Formula Nipple Type: Slow - flow  LATCH Score/Interventions                      Lactation Tools Discussed/Used     Consult Status Consult Status: Follow-up Date: 02/21/16 Follow-up type: In-patient    Silas FloodSharon S Taralee Marcus 02/20/2016, 3:35 PM

## 2016-02-21 DIAGNOSIS — K567 Ileus, unspecified: Secondary | ICD-10-CM

## 2016-02-21 DIAGNOSIS — K9189 Other postprocedural complications and disorders of digestive system: Secondary | ICD-10-CM

## 2016-02-21 HISTORY — DX: Other postprocedural complications and disorders of digestive system: K56.7

## 2016-02-21 LAB — TYPE AND SCREEN
ABO/RH(D): O POS
ANTIBODY SCREEN: NEGATIVE
UNIT DIVISION: 0
Unit division: 0

## 2016-02-21 MED ORDER — ONDANSETRON HCL 4 MG/2ML IJ SOLN
4.0000 mg | Freq: Four times a day (QID) | INTRAMUSCULAR | Status: DC | PRN
  Administered 2016-02-21: 4 mg via INTRAVENOUS
  Filled 2016-02-21: qty 2

## 2016-02-21 MED ORDER — OXYCODONE-ACETAMINOPHEN 5-325 MG PO TABS: 1.0000 | ORAL_TABLET | Freq: Four times a day (QID) | ORAL | Status: DC | PRN

## 2016-02-21 NOTE — Progress Notes (Signed)
Post Partum Day #3 Subjective:  Lindsey Molina is a 40 y.o. [id number] 8663w0d s/p elective repeat c-section.  No acute events overnight.  Pt denies problems with ambulating. Currently NPO. Per nursing, has been unable to void since last night x2 (bladder scan x2 with ~500cc, in and out catheterizations x2). Notes nausea, but no vomiting. Pain is poorly controlled.  She has not had flatus. She has not had bowel movement.  Lochia Minimal.  Plan for birth control is IUD.  Method of Feeding: Breast  Objective: Blood pressure 130/77, pulse 74, temperature 98.3 F (36.8 C), temperature source Oral, resp. rate 20, SpO2 99 %, unknown if currently breastfeeding.  Physical Exam:  General: alert, cooperative and no distress Lochia:normal flow Chest: CTAB Heart: RRR no m/r/g Abdomen: +BS, soft, nontender,  Uterine Fundus: firm, diffusely tender, distended DVT Evaluation: No evidence of DVT seen on physical exam. Extremities: no edema   Recent Labs  02/18/16 1108 02/19/16 0506  HGB 11.6* 12.0  HCT 36.1 35.7*    Assessment/Plan:  ASSESSMENT: Lindsey Molina is a 40 y.o. [id number] 1463w0d s/p elective c-section. # Ileus: Currently NPO. Refusing fluids. Consider NG tube placement. # Urinary Retention: New onset. Suspect secondary to constipation. Note urine output following c-section until last night. Bladder scan x2 with ~500cc, in and out catheterization x2 with 400cc. Continue to monitor. Breastfeeding  Contraception: IUD Threatened to leave AMA earlier this morning. Currently agreeable to staying.   LOS: 3 days   [facility] CenterRaleigh Molina 02/21/2016, 7:26 AM   OB fellow attestation Post Partum Day 3/POD#3 I have seen and examined this patient and agree with above documentation in the resident's note.   Lindsey Molina is a 40 y.o. [id number] s/p rLTCS.  Pt reports voiding or po intake- diagnosed with ileus yesterday with 12 cm of dilation of the small bowel. Reports she just passed gas. Pain is well  controlled.    PE:  BP 130/77 mmHg  Pulse 74  Temp(Src) 98.3 F (36.8 C) (Oral)  Resp 20  SpO2 99%  Breastfeeding? Unknown Gen: well appearing Heart: reg rate Lungs: normal WOB Fundus firm Abd: Soft, but distended.  BS in RLQ but otherwise paucity of BS.  Mild TTP. No rebound or guarding.  Ext: soft, no pain, no edema  Plan for discharge:   #PostOp ileus: will advance diet to clear liquid #Urinary retention: continue bladder scans and if > 500. Recommend placing foley to prevent bladder stretch injury.   Federico FlakeKimberly Molina Lindsey [place]on, MD 9:54 AM

## 2016-02-21 NOTE — Progress Notes (Signed)
After patient tried to urinate, patients husband asked me why she was not able to pee and I explained some different reasons and he said to stop these fluids that they weren't doing anything for her. I told him that the doctors were letting her bowel rest and would be in to evaluate this morning and he said that if nothing is done then they will leave and go somewhere else. After I&O catheterizing her, she told me to stop the fluids so I did.

## 2016-02-21 NOTE — Lactation Note (Signed)
This note was copied from a baby's chart. Lactation Consultation Note  Patient Name: Girl Maurice SmallMina Ben EAVWU'JToday's Date: 02/21/2016   Visited with Mom and FOB today, baby 5474 hrs old.   Baby receiving formula by bottle along with breastfeeding, receiving 20-60 ml per feeding.  Mom states her milk is coming in, breasts are soft to palpation.  Baby had just fed an hour prior, and FOB fed baby 20 ml of formula after.  Encouraged Mom to exclusively breast feed now, to avoid becoming engorged. ( Mom does have the complication of a severe colonic ileus, and in significant pain. )  Told FOB that if she would rather pump, we could set this up for her.  They both shook their heads and said NO, baby would breast feed.  Encouraged them to call for assistance as needed.  Follow up in am and prn.   Judee ClaraSmith, Itzael Liptak E 02/21/2016, 3:36 PM

## 2016-02-25 ENCOUNTER — Telehealth: Payer: Self-pay

## 2016-02-25 NOTE — Telephone Encounter (Signed)
Pt's husband called and stated that he is calling for his wife.

## 2016-02-28 NOTE — Telephone Encounter (Signed)
Called and spoke with pt's husband and he informed me that he just wanted to set up an appt in which has been taken care of.

## 2016-03-25 ENCOUNTER — Ambulatory Visit (INDEPENDENT_AMBULATORY_CARE_PROVIDER_SITE_OTHER): Payer: Medicaid Other | Admitting: Certified Nurse Midwife

## 2016-03-25 ENCOUNTER — Ambulatory Visit (INDEPENDENT_AMBULATORY_CARE_PROVIDER_SITE_OTHER): Payer: Self-pay | Admitting: Clinical

## 2016-03-25 ENCOUNTER — Encounter: Payer: Self-pay | Admitting: Certified Nurse Midwife

## 2016-03-25 DIAGNOSIS — F4321 Adjustment disorder with depressed mood: Secondary | ICD-10-CM

## 2016-03-25 LAB — POCT PREGNANCY, URINE: PREG TEST UR: NEGATIVE

## 2016-03-25 NOTE — Progress Notes (Signed)
  ASSESSMENT: Pt currently experiencing Adjustment disorder with depressed mood. [facility] Pt would benefit from psychoeducation and brief therapeutic interventions regarding coping with symptoms of depression.  Stage of Change:contemplative   PLAN: 1. F/U with behavioral health clinician in one month  2. Psychiatric Medications: none 3. Behavioral recommendations:   -Go home and make lunch today -Read educational material regarding coping with symptoms of depression  -Consider additional strategies for coping with depression, as discussed in office visit(discuss further at next visit)  SUBJECTIVE: Pt. referred by Denyse AmassBhambri, CNM for symptoms of depression Pt. reports the following symptoms/concerns: Pt states that her primary concerns are that she feels fatigued and cries daily, and has not felt depressed prior to recent pregnancy. Pt looks forward to cooking lunch today; [facility] at this time, finds it helpful to talk about feelings. Duration of problem: Less than six months Severity: moderate   OBJECTIVE: Orientation & Cognition: Oriented x3. Thought processes normal and appropriate to situation. Mood: teary Affect: appropriate Appearance: appropriate Risk of harm to self or others: no known risk of harm to self or others Substance use: none Assessments administered: [place] 3   Diagnosis: Adjustment disorder with depressed mood CPT Code: F43.21  -------------------------------------------- Other(s) present in the room: Video interpreter, Arabic([place] dialect)  Time spent with patient in exam room: 60 minutes 10:05  to 11:05am Depression screen [facility] 2/9 11/25/2015 10/04/2015 08/23/2015  Decreased Interest 0 0 0  Down, Depressed, Hopeless 0 0 0  PHQ - 2 Score 0 0 0

## 2016-03-25 NOTE — Progress Notes (Signed)
Patient ID: Lindsey SmallMina Molina, female   DOB: 11/05/1975, 40 y.o.   MRN: [id number] Subjective:    Used Video interpreter Johnny BridgeMartha [phone]#[id number].  Lindsey Molina is a 40 y.o. female who presents for a postpartum visit. She is 5 weeks postpartum following a repeat c/section. I have fully reviewed the prenatal and intrapartum course. The delivery was at 255w5d gestational weeks. Outcome: repeat cesarean section, low transverse incision. Anesthesia: spinal. Postpartum course has been uncomplicated. Baby's course has been uncomplicated. Baby is feeding by breast. Bleeding no bleeding. Bowel function is normal. Bladder function is normal. Patient is not sexually active. Contraception method is planning Mirena. Postpartum depression screening: negative.  The following portions of the patient's history were reviewed and updated as appropriate: allergies, current medications, past family history, past medical history, past social history, past surgical history and problem list.  Review of Systems Pertinent items noted in HPI and remainder of comprehensive ROS otherwise negative.   Objective:    There were no vitals taken for this visit.  General:  alert, cooperative and no distress   Breasts:    Lungs: clear to auscultation bilaterally  Heart:  regular rate and rhythm, S1, S2 normal, no murmur, click, rub or gallop  Abdomen: soft, non-tender; bowel sounds normal; no masses,  no organomegaly and honeycomb dsg removed, incision well healed, no erythema or edema   Vulva:  deferred  Vagina:   Cervix:    Corpus:   Adnexa:    Rectal Exam:         Assessment:   Postpartum state S/p Cesarean section  Normal postpartum exam. Pap smear not done at today's visit.   Plan:    1. Contraception: IUD-declines today, will schedule appt  2. Follow up in: 2 weeks or as needed.

## 2016-04-23 ENCOUNTER — Ambulatory Visit (INDEPENDENT_AMBULATORY_CARE_PROVIDER_SITE_OTHER): Payer: Self-pay | Admitting: Clinical

## 2016-04-23 DIAGNOSIS — F4321 Adjustment disorder with depressed mood: Secondary | ICD-10-CM

## 2016-04-23 NOTE — Progress Notes (Signed)
  ASSESSMENT: Pt currently experiencing a reduction in symptoms of depression.  Pt needs to f/u if symptoms of depression return.  Stage of Change: contemplative  PLAN: 1. F/U with behavioral health clinician as needed 2. Psychiatric Medications: none 3. Behavioral recommendations:   -Continue to take daily naps when baby naps  SUBJECTIVE: Pt. referred by Donette Larry, CNM,  for symptoms of depression at last visit Pt. reports the following symptoms/concerns: Pt states that she has been feeling better in past month; attributes feelings of depression at last visit to lack of sleep and being tired.Pt is eating and sleeping well, has supportive people in her life to help, as needed, and she has been able to sleep when baby sleeps.  Duration of problem: one month resolved Severity: mild   OBJECTIVE: Orientation & Cognition: Oriented x3. Thought processes normal and appropriate to situation. Mood: appropriate Affect: appropriate Appearance: appropriate Risk of harm to self or others: no known risk of harm to self or others Substance use: none Assessments administered: PHQ9: 1/ GAD7: 1  Diagnosis: Adjustment disorder with depressed mood, resolved CPT Code: F43.21  -------------------------------------------- Other(s) present in the room: 12yo daughter, 5 month old daughter, video interpreter ([place] Arabic) until in-person interpreter arrived  Time spent with patient in exam room: 28 minutes 7:55 to 8:23am   Depression screen [facility] 2/9 04/23/2016 11/25/2015 10/04/2015 08/23/2015  Decreased Interest 0 0 0 0  Down, Depressed, Hopeless 0 0 0 0  PHQ - 2 Score 0 0 0 0  Tired, decreased energy 0 - - -  Change in appetite 1 - - -  Feeling bad or failure about yourself  0 - - -  Trouble concentrating 0 - - -  Moving slowly or fidgety/restless 0 - - -  Suicidal thoughts 0 - - -  Difficult doing work/chores Not difficult at all - - -   GAD 7 : Generalized Anxiety Score 04/23/2016  Nervous,  Anxious, on Edge 0  Control/stop worrying 0  Worry too much - different things 1  Trouble relaxing 0  Restless 0  Easily annoyed or irritable 0  Afraid - awful might happen 0  Total GAD 7 Score 1  Anxiety Difficulty Not difficult at all

## 2016-05-12 ENCOUNTER — Ambulatory Visit: Payer: Self-pay | Admitting: Medical

## 2016-08-19 ENCOUNTER — Ambulatory Visit (INDEPENDENT_AMBULATORY_CARE_PROVIDER_SITE_OTHER): Payer: Self-pay | Admitting: Family Medicine

## 2016-08-19 VITALS — BP 116/68 | HR 95 | Temp 99.9°F | Resp 16 | Ht 64.5 in | Wt 180.0 lb

## 2016-08-19 DIAGNOSIS — N61 Mastitis without abscess: Secondary | ICD-10-CM

## 2016-08-19 MED ORDER — DICLOXACILLIN SODIUM 500 MG PO CAPS: 500.0000 mg | ORAL_CAPSULE | Freq: Four times a day (QID) | ORAL | 0 refills | Status: DC

## 2016-08-19 NOTE — Patient Instructions (Addendum)
Start Dicloxacillin 1 tablet every 6 hours for infection.  Apply cold and hot compresses to right breast for comfort.  Continue to nurse or pump to prevent worsening of pain and or infection.    IF you received an x-ray today, you will receive an invoice from [facility]. Please contact [facility] at [phone] with questions or concerns regarding your invoice.   IF you received labwork today, you will receive an invoice from [facility]. Please contact [facility] at [phone] with questions or concerns regarding your invoice.   Our billing staff will not be able to assist you with questions regarding bills from these companies.  You will be contacted with the lab results as soon as they are available. The fastest way to get your results is to activate your My Chart account. Instructions are located on the last page of this paperwork. If you have not heard from us regarding the results in 2 weeks, please contact this office.     Breastfeeding and Mastitis Mastitis is inflammation of the breast tissue. It can occur in women who are breastfeeding. This can make breastfeeding painful. Mastitis will sometimes go away on its own. Your health care provider will help determine if treatment is needed. CAUSES Mastitis is often associated with a blocked milk (lactiferous) duct. This can happen when too much milk builds up in the breast. Causes of excess milk in the breast can include:  Poor latch-on. If your baby is not latched onto the breast properly, she or he may not empty your breast completely while breastfeeding.  Allowing too much time to pass between feedings.  Wearing a bra or other clothing that is too tight. This puts extra pressure on the lactiferous ducts so milk does not flow through them as it should. Mastitis can also be caused by a bacterial infection. Bacteria may enter the breast tissue through cuts or openings in the skin. In  women who are breastfeeding, this may occur because of cracked or irritated skin. Cracks in the skin are often caused when your baby does not latch on properly to the breast. SIGNS AND SYMPTOMS  Swelling, redness, tenderness, and pain in an area of the breast.  Swelling of the glands under the arm on the same side.  Fever may or may not accompany mastitis. If an infection is allowed to progress, a collection of pus (abscess) may develop. DIAGNOSIS  Your health care provider can usually diagnose mastitis based on your symptoms and a physical exam. Tests may be done to help confirm the diagnosis. These may include:  Removal of pus from the breast by applying pressure to the area. This pus can be examined in the lab to determine which bacteria are present. If an abscess has developed, the fluid in the abscess can be removed with a needle. This can also be used to confirm the diagnosis and determine the bacteria present. In most cases, pus will not be present.  Blood tests to determine if your body is fighting a bacterial infection.  Mammogram or ultrasound tests to rule out other problems or diseases. TREATMENT  Mastitis that occurs with breastfeeding will sometimes go away on its own. Your health care provider may choose to wait 24 hours after first seeing you to decide whether a prescription medicine is needed. If your symptoms are worse after 24 hours, your health care provider will likely prescribe an antibiotic medicine to treat the mastitis. He or she will determine which bacteria are most likely  causing the infection and will then select an appropriate antibiotic medicine. This is sometimes changed based on the results of tests performed to identify the bacteria, or if there is no response to the antibiotic medicine selected. Antibiotic medicines are usually given by mouth. You may also be given medicine for pain. HOME CARE INSTRUCTIONS  Only take over-the-counter or prescription medicines  for pain, fever, or discomfort as directed by your health care provider.  If your health care provider prescribed an antibiotic medicine, take the medicine as directed. Make sure you finish it even if you start to feel better.  Do not wear a tight or underwire bra. Wear a soft, supportive bra.  Increase your fluid intake, especially if you have a fever.  Continue to empty the breast. Your health care provider can tell you whether this milk is safe for your infant or needs to be thrown out. You may be told to stop nursing until your health care provider thinks it is safe for your baby. Use a breast pump if you are advised to stop nursing.  Keep your nipples clean and dry.  Empty the first breast completely before going to the other breast. If your baby is not emptying your breasts completely for some reason, use a breast pump to empty your breasts.  If you go back to work, pump your breasts while at work to stay in time with your nursing schedule.  Avoid allowing your breasts to become overly filled with milk (engorged). SEEK MEDICAL CARE IF:  You have pus-like discharge from the breast.  Your symptoms do not improve with the treatment prescribed by your health care provider within 2 days. SEEK IMMEDIATE MEDICAL CARE IF:  Your pain and swelling are getting worse.  You have pain that is not controlled with medicine.  You have a red line extending from the breast toward your armpit.  You have a fever or persistent symptoms for more than 2-3 days.  You have a fever and your symptoms suddenly get worse. MAKE SURE YOU:   Understand these instructions.  Will watch your condition.  Will get help right away if you are not doing well or get worse. This information is not intended to replace advice given to you by your health care provider. Make sure you discuss any questions you have with your health care provider. Document Released: 01/09/2005 Document Revised: 09/19/2013 Document  Reviewed: 04/20/2013 Elsevier Interactive Patient Education  2017 [employer]

## 2016-08-19 NOTE — Progress Notes (Signed)
Patient ID: Lindsey SmallMina Molina, female    DOB: 02/25/1976, 40 y.o.   MRN: [id number]  PCP: No PCP Per Patient  Chief Complaint  Patient presents with  . Breast Pain    Right side, x 2 days   . Fever    Subjective:   HPI 40 year old female presents for evaluation of right breast rash and pain times 2 days. [facility] Reports that she is breast feeding her 157 month old infant and has noticed increased pain with nursing of the right breast. Describes medial side of right breast is very painful touch. Denies fever. Reports vomiting times one day. Denies present nausea. Took ibuprofen with some relief breast pain.  Social History   Social History  . Marital status: Married    Spouse name: N/A  . Number of children: N/A  . Years of education: N/A   Occupational History  . Not on file.   Social History Main Topics  . Smoking status: Never Smoker  . Smokeless tobacco: Never Used  . Alcohol use No  . Drug use: No  . Sexual activity: Not Currently    Birth control/ protection: None   Other Topics Concern  . Not on file   Social History Narrative  . No narrative on file   Family History  Problem Relation Age of Onset  . Alcohol abuse Neg Hx   . Arthritis Neg Hx   . Asthma Neg Hx   . Birth defects Neg Hx   . Cancer Neg Hx   . COPD Neg Hx   . Depression Neg Hx   . Diabetes Neg Hx   . Drug abuse Neg Hx   . Early death Neg Hx   . Hearing loss Neg Hx   . Heart disease Neg Hx   . Hyperlipidemia Neg Hx   . Hypertension Neg Hx   . Kidney disease Neg Hx   . Learning disabilities Neg Hx   . Mental illness Neg Hx   . Mental retardation Neg Hx   . Miscarriages / Stillbirths Neg Hx   . Stroke Neg Hx   . Vision loss Neg Hx   . Varicose Veins Neg Hx     Review of Systems See HPI Patient Active Problem List   Diagnosis Date Noted  . Ileus, postoperative (HCC) 02/21/2016  . Status post repeat low transverse cesarean section 02/18/2016  . Language barrier  affecting health care 08/15/2015  . H/O gestational diabetes in prior pregnancy, currently pregnant 08/15/2015  . Advanced maternal age in multigravida 08/15/2015  . Anemia of mother in pregnancy, antepartum 07/26/2015  . Ovarian cyst in pregnancy 07/26/2015  . Supervision of high risk pregnancy, antepartum 07/26/2015  . Previous cesarean delivery, antepartum 07/26/2015  . 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 Authorizing Provider  NON FORMULARY Take 1 tablet by mouth 2 (two) times daily. Medicine she takes for blood   Yes Historical Provider, MD  Prenatal Vit-Fe Fumarate-FA (PRENATAL VITAMINS PLUS) 27-1 MG TABS Take 1 tablet by mouth daily. Patient not taking: Reported on 08/19/2016 11/27/15   Kathrynn RunningNoah Bedford Wouk, MD     No Known Allergies     Objective:  Physical Exam  Constitutional: She appears well-developed and well-nourished.  HENT:  Head: Normocephalic and atraumatic.  Neck: Normal range of motion. Neck supple.  Cardiovascular: Normal rate, regular rhythm, normal heart sounds and intact distal pulses.  Pulmonary/Chest: Effort normal and breath sounds normal. Right breast exhibits skin change and tenderness.    Lymphadenopathy:    She has no cervical adenopathy.  Skin: Skin is warm. Rash noted. There is erythema.  Psychiatric: She has a normal mood and affect. Her behavior is normal. Judgment and thought content normal.     Vitals:   08/19/16 1017  BP: 116/68  Pulse: 95  Resp: 16  Temp: 99.9 F (37.7 C)   Assessment & Plan:  1. Mastitis Plan: Start Dicloxacillin 1 tablet every 6 hours for infection times times 10 days.  Apply cold and hot compresses to right breast for comfort.  Continue to nurse or pump to prevent worsening of pain and or infection.  Return for follow-up if skin infection doesn't resolve.  Godfrey PickKimberly S. Tiburcio PeaHarris, MSN, FNP-C [facility]

## 2016-08-24 ENCOUNTER — Ambulatory Visit (INDEPENDENT_AMBULATORY_CARE_PROVIDER_SITE_OTHER): Payer: Self-pay | Admitting: Physician Assistant

## 2016-08-24 VITALS — BP 120/76 | HR 66 | Temp 98.2°F | Resp 16 | Ht 63.0 in | Wt 180.0 lb

## 2016-08-24 DIAGNOSIS — N61 Mastitis without abscess: Secondary | ICD-10-CM

## 2016-08-24 NOTE — Progress Notes (Signed)
Lindsey SmallMina Wien  MRN: [id number] DOB: 04/08/1976  PCP: No PCP Per Patient  Subjective:  Pt presents to clinic for follow-up mastitis. She was here five days ago and evaluated by Joaquin CourtsKimberly Harris. Started Doxycycline TID x 10 days, apply hot compresses, and asked to continue breast feeding. She is currently breast feeding a 377 month old infant.  Today she complains of breast pain and drainage from her right breast two days ago. She saw what looked like puss come out of her nipple. She stopped breast feeding from right breast and is only feeding out of left. She is not pumping. She is taking her antibiotics "3-4 times a day". She reports her over all pain and tenderness is much better. Right breast is still tender when she pressed on it.  Denies fever, chills, nausea, abdominal pain.   Review of Systems  Constitutional: Negative for chills, diaphoresis, fatigue and fever.  HENT: Negative for congestion, postnasal drip, rhinorrhea, sinus pressure, sneezing and sore throat.   Respiratory: Negative for cough, chest tightness, shortness of breath and wheezing.   Cardiovascular: Negative for chest pain and palpitations.  Gastrointestinal: Negative for abdominal pain, diarrhea, nausea and vomiting.  Skin: Negative for color change, rash and wound.  Neurological: Negative for weakness, light-headedness and headaches.    Patient Active Problem List   Diagnosis Date Noted  . Ileus, postoperative (HCC) 02/21/2016  . Status post repeat low transverse cesarean section 02/18/2016  . Language barrier affecting health care 08/15/2015  . H/O gestational diabetes in prior pregnancy, currently pregnant 08/15/2015  . Advanced maternal age in multigravida 08/15/2015  . Anemia of mother in pregnancy, antepartum 07/26/2015  . Ovarian cyst in pregnancy 07/26/2015  . Supervision of high risk pregnancy, antepartum 07/26/2015  . Previous cesarean delivery, antepartum 07/26/2015  . 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 dicloxacillin (DYNAPEN) 500 MG capsule Take 1 capsule (500 mg total) by mouth 4 (four) times daily. 40 capsule 0  . NON FORMULARY Take 1 tablet by mouth 2 (two) times daily. Medicine she takes for blood    . Prenatal Vit-Fe Fumarate-FA (PRENATAL VITAMINS PLUS) 27-1 MG TABS Take 1 tablet by mouth daily. 30 tablet 11   No current facility-administered medications on file prior to visit.     No Known Allergies   Objective:  BP 120/76 (BP Location: Right Arm, Patient Position: Sitting, Cuff Size: Large)   Pulse 66   Temp 98.2 F (36.8 C) (Oral)   Resp 16   Ht 5\' 3"  (1.6 m)   Wt 180 lb (81.6 kg)   SpO2 99%   BMI 31.89 kg/m   Physical Exam  Constitutional: She is oriented to person, place, and time and well-developed, well-nourished, and in no distress. No distress.  Cardiovascular: Normal rate, regular rhythm and normal heart sounds.   Pulmonary/Chest: Right breast exhibits no skin change and no tenderness.    Neurological: She is alert and oriented to person, place, and time. GCS score is 15.  Skin: Skin is warm and dry.  Psychiatric: Mood, memory, affect and judgment normal.  Vitals reviewed.   Assessment and Plan :  1. Mastitis - WOUND CULTURE - Patient is improving since her last visit. She is concerned because of abnormal drainage two days ago. Discussed continuation of current therapy, however she needs to start draining milk again from right breast.  Wound culture taken, pending. Will contact with results.  Supportive care encouraged: Doreatha MartinFinish course of antibiotics and take as directed, breast feed or pump 8-12 times a days, take Tylenol for pain as needed.  - RTC if symptoms fail to improve.    Marco CollieWhitney Alayne Estrella, PA-C  [facility] 08/24/2016 11:57 AM

## 2016-08-24 NOTE — Patient Instructions (Addendum)
(  Please continue taking your antibiotics. Please finish the entire course. Take as prescribed: 4 times a day. Stay well hydrated by drinking plenty of water. Breast feeding or pumping should continue daily - at least 8-12 times a day - to promote milk removal. Use a warm compress on your right breast 3 times a day for 15 minutes at a time. You can take Tylenol for pain if needed.)  Thank you for coming in today. I hope you feel we met your needs.  Feel free to call [facility] if you have any questions or further requests.  Please consider signing up for MyChart if you do not already have it, as this is a great way to communicate with me.  Best,  Whitney McVey, PA-C   IF you received an x-ray today, you will receive an invoice from [facility]. Please contact [facility] at [phone] with questions or concerns regarding your invoice.   IF you received labwork today, you will receive an invoice from [employer]. Please contact [facility] at [phone] with questions or concerns regarding your invoice.   Our billing staff will not be able to assist you with questions regarding bills from these companies.  You will be contacted with the lab results as soon as they are available. The fastest way to get your results is to activate your My Chart account. Instructions are located on the last page of this paperwork. If you have not heard from [place] regarding the results in 2 weeks, please contact this office.

## 2016-08-27 LAB — WOUND CULTURE
Gram Stain: NONE SEEN
Organism ID, Bacteria: NORMAL

## 2016-08-27 NOTE — Progress Notes (Signed)
Called and spoke with patient's husband. [name] is getting better every day. She is pumping 7 times a day and is still taking her antibiotics as prescribed.

## 2017-02-13 IMAGING — US US MFM FETAL BPP W/O NON-STRESS
1 series · 12 of 12 positions shown · non-contrast
Comparison: none

[Series 1: us mfm fetal bpp w/o non-stress · 12 acquisitions, 12 frames shown]
[im 1/12]
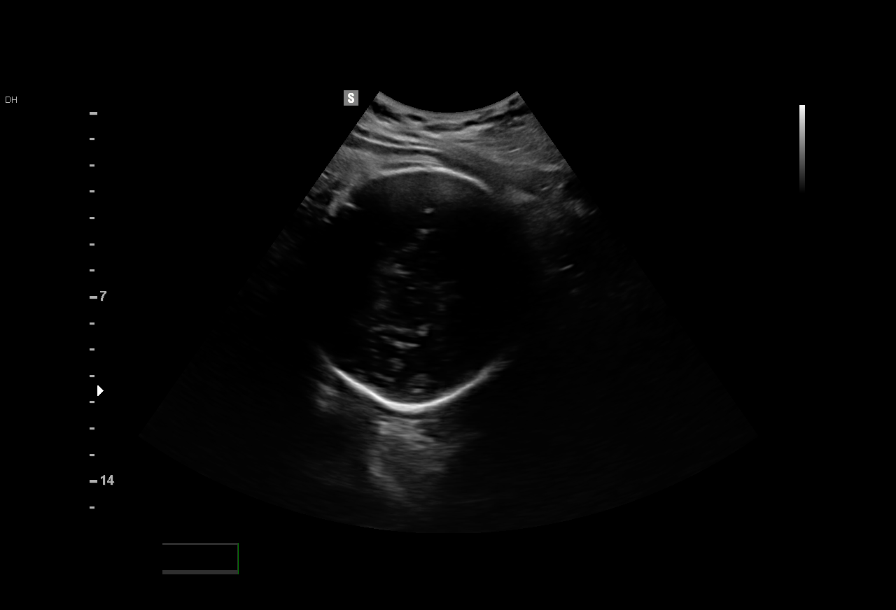
[im 2/12]
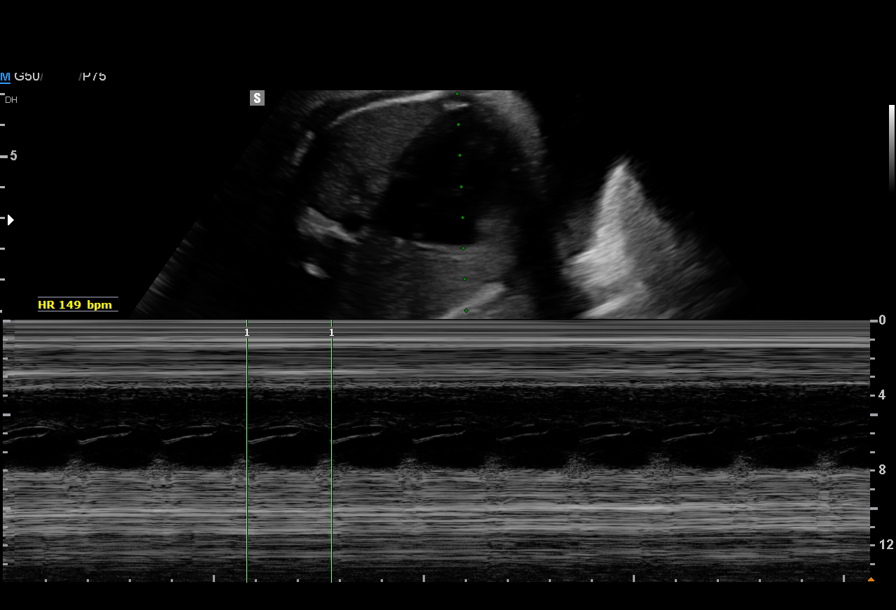
[im 3/12]
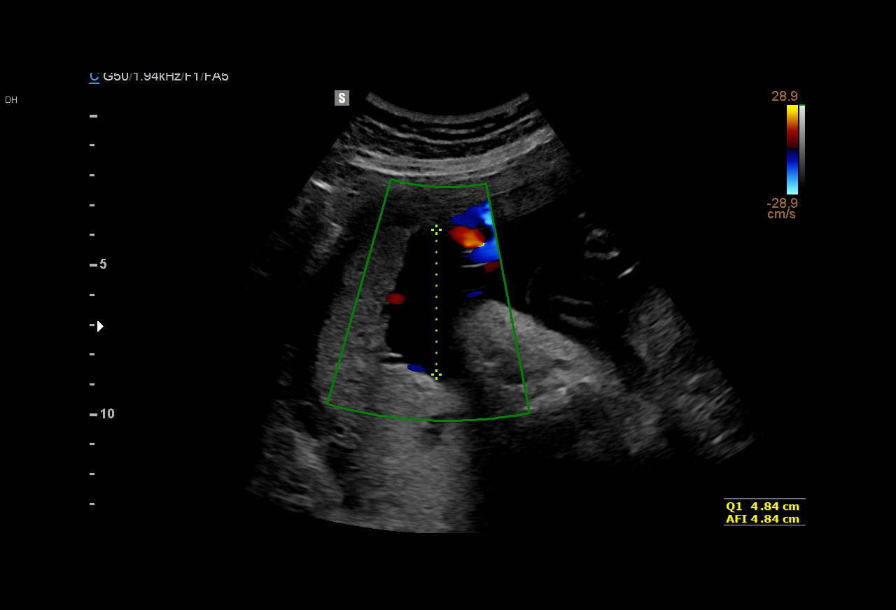
[im 4/12]
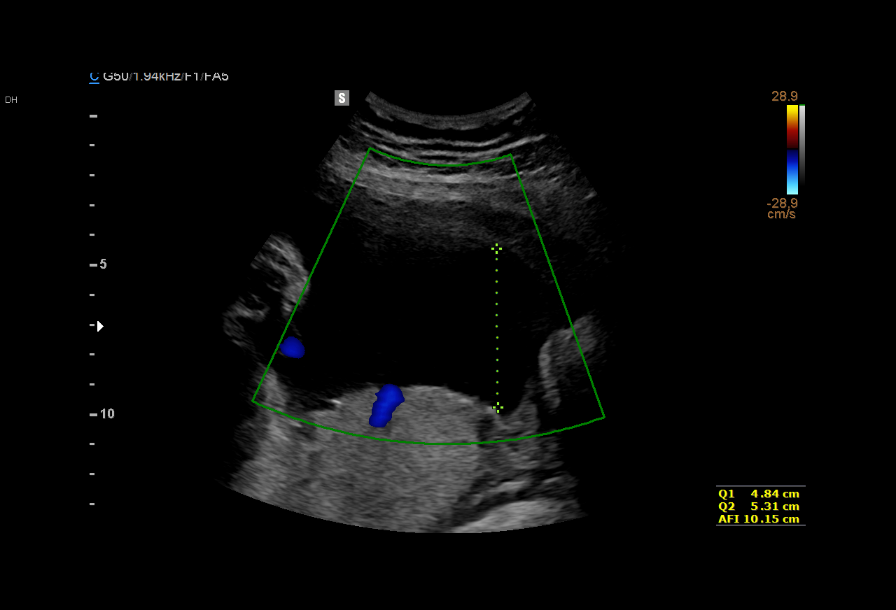
[im 5/12]
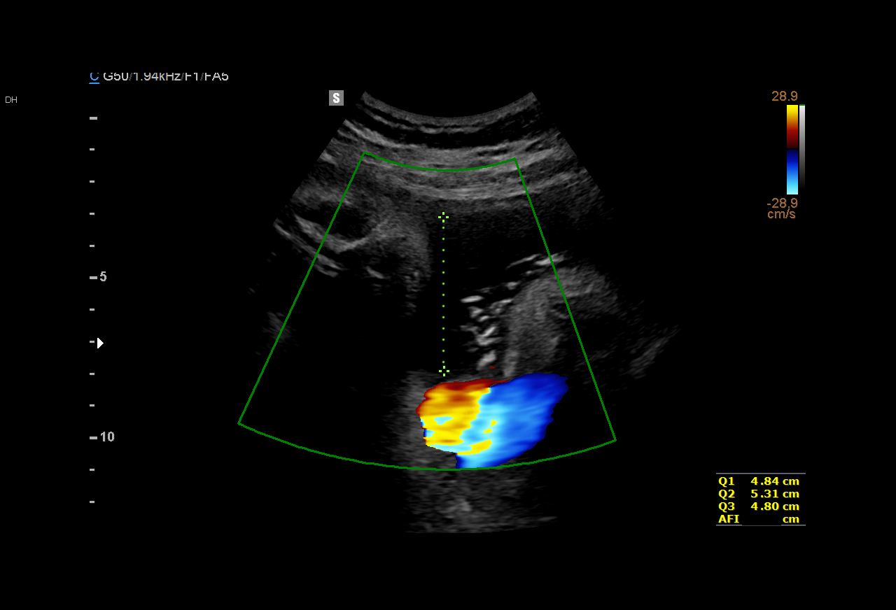
[im 6/12]
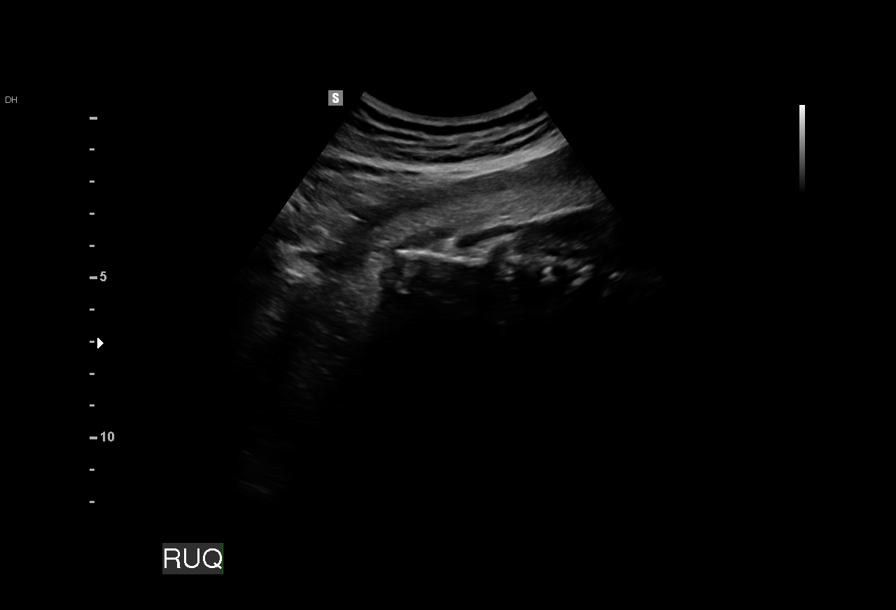
[im 7/12]
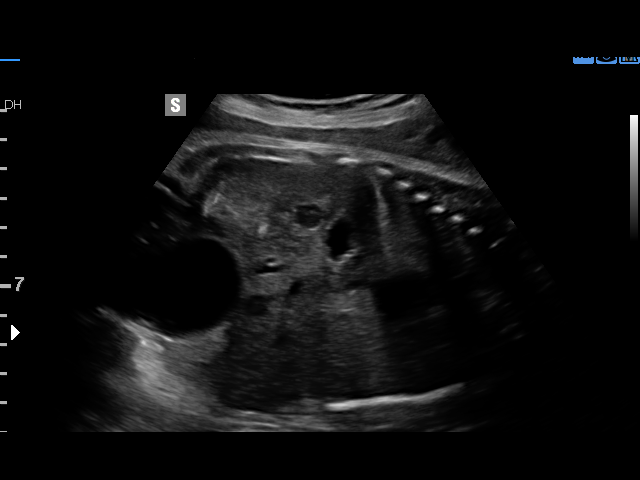
[im 8/12]
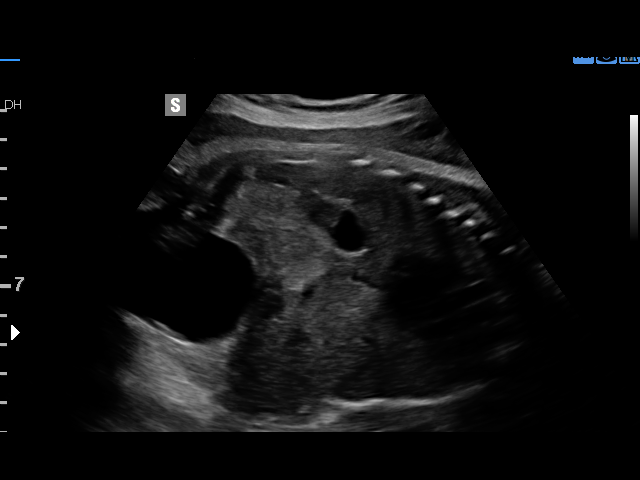
[im 9/12]
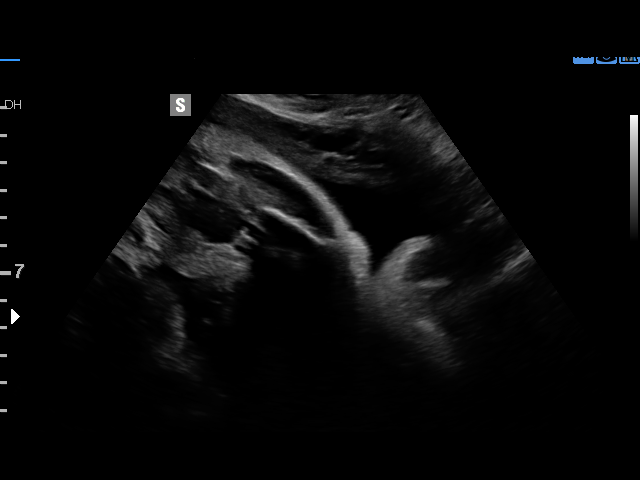
[im 10/12]
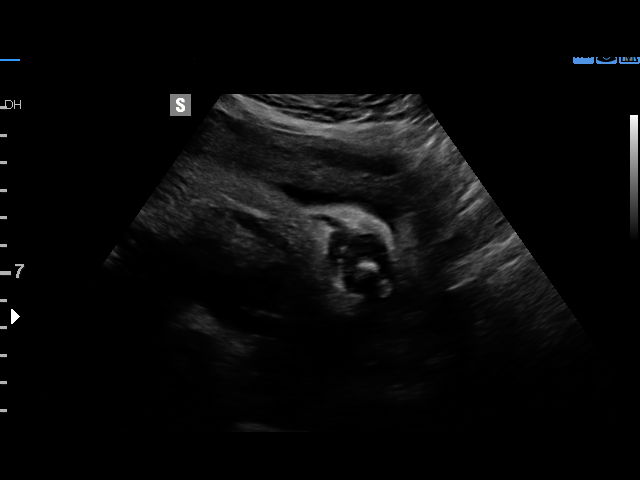
[im 11/12]
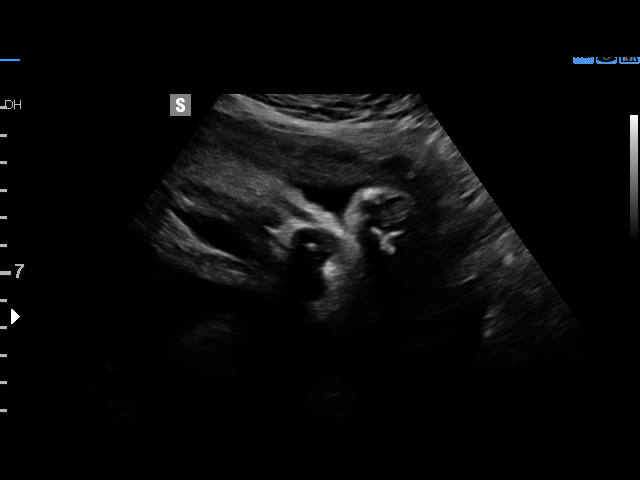
[im 12/12]
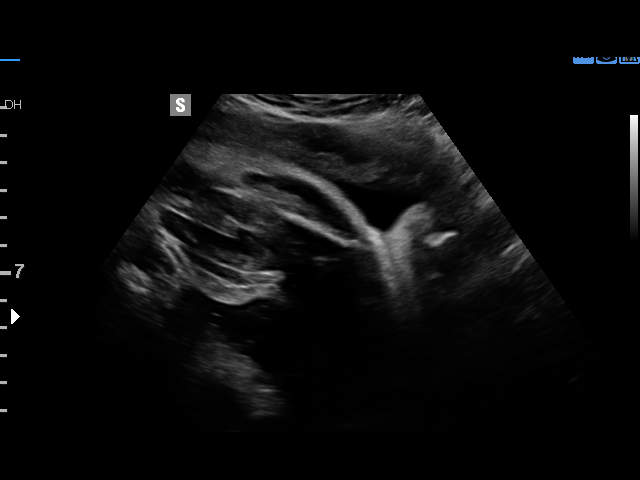

[12 of 12 positions shown; findings below may reference images not displayed]

Hospital Clinic-
Faculty Physician
OB/Gyn Clinic
GAMEEL NP
Clinics

1  UYEN NILSSON            [phone]      [phone]     [id number]
Indications

36 weeks gestation of pregnancy
Abnormal maternal glucose tolerance,
antepartum (NL BhrXMM)
Poor obstetric history: Previous gestational
diabetes
Advanced maternal age multigravida 40,
third trimester - low risk NIPS
Previous cesarean delivery, antepartum x 2
Advanced paternal age
OB History

Gravidity:    3         Term:   2        Prem:   0        SAB:   0
TOP:          0       Ectopic:  0        Living: 2
Fetal Evaluation

Num Of Fetuses:     1
Fetal Heart         149
Rate(bpm):
Cardiac Activity:   Observed
Presentation:       Cephalic
Amniotic Fluid
AFI FV:      Subjectively within normal limits

AFI Sum(cm)     %Tile       Largest Pocket(cm)
14.95           55

RUQ(cm)                     LUQ(cm)        LLQ(cm)
4.84
Biophysical Evaluation

Amniotic F.V:   Pocket => 2 cm two         F. Tone:        Observed
planes
F. Movement:    Observed                   Score:          [DATE]
F. Breathing:   Observed
Gestational Age

Best:          36w 1d    Det. By:   Early Ultrasound         EDD:   02/25/16
(07/04/15)
Impression

Single living intrauterine pregnancy at 36 weeks 1 day.
Normal amniotic fluid volume.
BPP [DATE].
Recommendations

Continue antenatal testing.

## 2017-11-09 ENCOUNTER — Encounter (HOSPITAL_COMMUNITY): Payer: Self-pay

## 2017-11-09 ENCOUNTER — Other Ambulatory Visit: Payer: Self-pay

## 2017-11-09 ENCOUNTER — Emergency Department (HOSPITAL_COMMUNITY)
Admission: EM | Admit: 2017-11-09 | Discharge: 2017-11-09 | Disposition: A | Discharge status: Home / Self Care | Payer: No Typology Code available for payment source | Attending: Emergency Medicine | Admitting: Emergency Medicine

## 2017-11-09 ENCOUNTER — Emergency Department (HOSPITAL_COMMUNITY): Payer: No Typology Code available for payment source

## 2017-11-09 DIAGNOSIS — R0789 Other chest pain: Secondary | ICD-10-CM | POA: Insufficient documentation

## 2017-11-09 DIAGNOSIS — M25511 Pain in right shoulder: Secondary | ICD-10-CM | POA: Insufficient documentation

## 2017-11-09 LAB — POC URINE PREG, ED: Preg Test, Ur: NEGATIVE

## 2017-11-09 MED ORDER — NAPROXEN 500 MG PO TABS: 500.0000 mg | ORAL_TABLET | Freq: Two times a day (BID) | ORAL | 0 refills | Status: DC

## 2017-11-09 MED ORDER — METHOCARBAMOL 500 MG PO TABS
500.0000 mg | ORAL_TABLET | Freq: Every evening | ORAL | 0 refills | Status: DC | PRN
Start: 2017-11-09 — End: 2023-02-20
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
## 2017-11-09 NOTE — ED Notes (Signed)
Spouse Lindsey Molina [phone]
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
## 2017-12-28 ENCOUNTER — Encounter: Payer: Self-pay | Admitting: *Deleted

## 2018-12-04 ENCOUNTER — Emergency Department (HOSPITAL_COMMUNITY): Payer: BLUE CROSS/BLUE SHIELD

## 2018-12-04 ENCOUNTER — Other Ambulatory Visit: Payer: Self-pay

## 2018-12-04 ENCOUNTER — Encounter (HOSPITAL_COMMUNITY): Payer: Self-pay | Admitting: Emergency Medicine

## 2018-12-04 ENCOUNTER — Emergency Department (HOSPITAL_COMMUNITY)
Admission: EM | Admit: 2018-12-04 | Discharge: 2018-12-04 | Disposition: A | Discharge status: Home / Self Care | Payer: BLUE CROSS/BLUE SHIELD | Attending: Emergency Medicine | Admitting: Emergency Medicine

## 2018-12-04 DIAGNOSIS — Z79899 Other long term (current) drug therapy: Secondary | ICD-10-CM | POA: Diagnosis not present

## 2018-12-04 DIAGNOSIS — M109 Gout, unspecified: Secondary | ICD-10-CM

## 2018-12-04 DIAGNOSIS — M79671 Pain in right foot: Secondary | ICD-10-CM | POA: Diagnosis present

## 2018-12-04 MED ORDER — PREDNISONE 50 MG PO TABS: 50.0000 mg | ORAL_TABLET | Freq: Every day | ORAL | 0 refills | Status: DC

## 2018-12-04 MED ORDER — INDOMETHACIN 25 MG PO CAPS: 25.0000 mg | ORAL_CAPSULE | Freq: Three times a day (TID) | ORAL | 0 refills | Status: AC | PRN

## 2018-12-04 NOTE — ED Triage Notes (Signed)
The patient said (through her daughter) that her leg has been in pain and has been swollen since three days ago.   The patient said she has gotten progressively worse.  The patient has been taking ibuprofen and tylenol and it has not gotten better.  She is not able to walk on the leg due to the  Pain.  She rates her pain 10.

## 2018-12-04 NOTE — ED Notes (Signed)
Patient verbalizes understanding of discharge instructions. Opportunity for questioning and answers were provided. Armband removed by staff, pt discharged from ED ambulatory.   

## 2018-12-04 NOTE — Discharge Instructions (Addendum)
Take the medications as prescribed, follow up with your doctor to be rechecked next week, return for fever, worsening symptoms

## 2018-12-04 NOTE — ED Provider Notes (Signed)
[facility]   CSN: [id number] Arrival date & time: 12/04/18  1155  Family translated for patient.  Arabic language interpreter was offered  History   Chief Complaint Chief Complaint  Patient presents with  . Leg Swelling    HPI Lindsey SmallMina Stadel is a 43 y.o. female.   HPI Patient complains of pain in her right foot for several days.  Patient does not recall any injuries.  She has been having pain in the right midfoot and big toe.  It increases with palpation and movement.  Patient denies any prior problems.  No fevers or chills.  No calf or knee pain. Past Medical History:  Diagnosis Date  . Depression    current pregnancy  . Dizziness 05/25/2012   "severe; and I pass out"  . 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 Hypotension 05/25/2012  . Numbness and tingling 05/25/2012   "and pain right arm when I get mad,  nervous and/or blackout; sometimes; not always"  . Respiratory arrest (HCC) 05/25/2012   "occurs when I black out; I can't breath"  . Shortness of breath 05/25/2012   "occurs when I pass out"  . Syncope and collapse 2011; 05/24/2012    Patient Active Problem List   Diagnosis Date Noted  . Ileus, postoperative (HCC) 02/21/2016  . Status post repeat low transverse cesarean section 02/18/2016  . Language barrier affecting health care 08/15/2015  . H/O gestational diabetes in prior pregnancy, currently pregnant 08/15/2015  . Advanced maternal age in multigravida 08/15/2015  . Anemia of mother in pregnancy, antepartum 07/26/2015  . Ovarian cyst in pregnancy 07/26/2015  . Supervision of high risk pregnancy, antepartum 07/26/2015  . Previous cesarean delivery, antepartum 07/26/2015  . 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 CESAREAN SECTION  2005; 2011  . CESAREAN SECTION N/A 02/18/2016   Procedure: REPEAT CESAREAN SECTION;  Surgeon:  Reva Boresanya S Pratt, MD;  [facility];  Service: Obstetrics;  Laterality: N/A;     OB History    Gravida  3   Para  3   Term  3   Preterm  0   AB  0   Living  3     SAB  0   TAB  0   Ectopic  0   Multiple  0   Live Births  3            Home Medications    Prior to Admission medications   Medication Sig Start Date End Date Taking? Authorizing Provider  dicloxacillin (DYNAPEN) 500 MG capsule Take 1 capsule (500 mg total) by mouth 4 (four) times daily. 08/19/16   Bing NeighborsHarris, Kimberly S, FNP  hydrocortisone (CORTEF) 10 MG tablet Take 10 mg by mouth daily.    [provider]  indomethacin (INDOCIN) 25 MG capsule Take 1 capsule (25 mg total) by mouth 3 (three) times daily as needed for up to 7 days. 12/04/18 12/11/18  Linwood DibblesKnapp, Nature Vogelsang, MD  methocarbamol (ROBAXIN) 500 MG tablet Take 1 tablet (500 mg total) by mouth at bedtime as needed for muscle spasms. 11/09/17   Maczis, Elmer SowMichael M, PA-C  naproxen (NAPROSYN) 500 MG tablet Take 1 tablet (500 mg total) by mouth 2 (two) times daily. 11/09/17   Maczis, Elmer SowMichael M, PA-C  NON FORMULARY Take 1 tablet by mouth 2 (two) times daily. Medicine she takes for blood  [provider]  predniSONE (DELTASONE) 50 MG tablet Take 1 tablet (50 mg total) by mouth daily. 12/04/18   Linwood Dibbles, MD  Prenatal Vit-Fe Fumarate-FA (PRENATAL VITAMINS PLUS) 27-1 MG TABS Take 1 tablet by mouth daily. 11/27/15   Wouk, Wilfred Curtis, MD    Family History Family History  Problem Relation Age of Onset  . Alcohol abuse Neg Hx   . Arthritis Neg Hx   . Asthma Neg Hx   . Birth defects Neg Hx   . Cancer Neg Hx   . COPD Neg Hx   . Depression Neg Hx   . Diabetes Neg Hx   . Drug abuse Neg Hx   . Early death Neg Hx   . Hearing loss Neg Hx   . Heart disease Neg Hx   . Hyperlipidemia Neg Hx   . Hypertension Neg Hx   . Kidney disease Neg Hx   . Learning disabilities Neg Hx   . Mental illness Neg Hx   . Mental retardation Neg Hx   .  Miscarriages / Stillbirths Neg Hx   . Stroke Neg Hx   . Vision loss Neg Hx   . Varicose Veins Neg Hx     Social History Social History   Tobacco Use  . Smoking status: Never Smoker  . Smokeless tobacco: Never Used  Substance Use Topics  . Alcohol use: No  . Drug use: No     Allergies   Patient has no known allergies.   Review of Systems Review of Systems  All other systems reviewed and are negative.    Physical Exam Updated Vital Signs BP (!) 125/100 (BP Location: Right Arm)   Pulse 75   Temp 98.4 F (36.9 C) (Oral)   Resp 20   LMP 09/28/2018   SpO2 98%   Physical Exam Vitals signs and nursing note reviewed.  Constitutional:      General: She is not in acute distress.    Appearance: She is well-developed.  HENT:     Head: Normocephalic and atraumatic.     Right Ear: External ear normal.     Left Ear: External ear normal.  Eyes:     General: No scleral icterus.       Right eye: No discharge.        Left eye: No discharge.     Conjunctiva/sclera: Conjunctivae normal.  Neck:     Musculoskeletal: Neck supple.     Trachea: No tracheal deviation.  Cardiovascular:     Rate and Rhythm: Normal rate.  Pulmonary:     Effort: Pulmonary effort is normal. No respiratory distress.     Breath sounds: No stridor.  Abdominal:     General: There is no distension.  Musculoskeletal:        General: No swelling or deformity.     Right knee: Normal.     Right ankle: Normal.     Right lower leg: Normal.     Right foot: Tenderness and bony tenderness present.     Comments: Tenderness primarily of the right big toe but also some tenderness in the midfoot, faint discoloration of the skin around the right great toe suggestive of possible bruising,  Skin:    General: Skin is warm and dry.     Findings: No rash.  Neurological:     Mental Status: She is alert.     Cranial Nerves: Cranial nerve deficit: no gross deficits.      ED Treatments / Results  Labs (all labs  ordered are listed, but only abnormal results are displayed) Labs Reviewed - No data to display  EKG None  Radiology Dg Foot Complete Right  Result Date: 12/04/2018 CLINICAL DATA:  Acute foot pain and swelling for the past 3 days. EXAM: RIGHT FOOT COMPLETE - 3+ VIEW COMPARISON:  None. FINDINGS: No acute fracture or dislocation. Joint spaces are preserved. Bone mineralization is normal. Molina plantar and Achilles enthesophytes. Mild diffuse soft tissue swelling. IMPRESSION: 1. Mild diffuse soft tissue swelling.  No acute osseous abnormality. Electronically Signed   By: Obie Dredge M.D.   On: 12/04/2018 14:33    Procedures Procedures (including critical care time)  Medications Ordered in ED Medications - No data to display   Initial Impression / Assessment and Plan / ED Course  I have reviewed the triage vital signs and the nursing notes.  Pertinent labs & imaging results that were available during my care of the patient were reviewed by me and considered in my medical decision making (see chart for details).   Patient's x-rays do not show any acute bony abnormality.  Patient symptoms are suggestive of gouty arthritis.  She has pain and inflammation of her big toe.  Exam does not suggest any acute infection.  She does not have any calf pain to suggest DVT.  Discharge home with prednisone and Indocin.  Outpatient follow-up with primary care doctor.  Final Clinical Impressions(s) / ED Diagnoses   Final diagnoses:  Gouty arthritis of toe    ED Discharge Orders         Ordered    predniSONE (DELTASONE) 50 MG tablet  Daily     12/04/18 1537    indomethacin (INDOCIN) 25 MG capsule  3 times daily PRN     12/04/18 1537           Linwood Dibbles, MD 12/04/18 [phone]

## 2018-12-04 NOTE — ED Notes (Signed)
This tech went into the room to obtain discharge vitals on pt. Pt not in room. Megan RN notified

## 2019-01-11 ENCOUNTER — Encounter (HOSPITAL_COMMUNITY): Payer: Self-pay

## 2019-01-11 ENCOUNTER — Emergency Department (HOSPITAL_COMMUNITY): Payer: BLUE CROSS/BLUE SHIELD

## 2019-01-11 ENCOUNTER — Other Ambulatory Visit: Payer: Self-pay

## 2019-01-11 ENCOUNTER — Emergency Department (HOSPITAL_COMMUNITY)
Admission: EM | Admit: 2019-01-11 | Discharge: 2019-01-11 | Disposition: A | Discharge status: Home / Self Care | Payer: BLUE CROSS/BLUE SHIELD | Attending: Emergency Medicine | Admitting: Emergency Medicine

## 2019-01-11 DIAGNOSIS — M5412 Radiculopathy, cervical region: Secondary | ICD-10-CM | POA: Diagnosis not present

## 2019-01-11 DIAGNOSIS — R0600 Dyspnea, unspecified: Secondary | ICD-10-CM | POA: Diagnosis not present

## 2019-01-11 DIAGNOSIS — M79602 Pain in left arm: Secondary | ICD-10-CM | POA: Insufficient documentation

## 2019-01-11 DIAGNOSIS — R0789 Other chest pain: Secondary | ICD-10-CM | POA: Diagnosis present

## 2019-01-11 LAB — HEPATIC FUNCTION PANEL
ALT: 15 U/L (ref 0–44)
AST: 19 U/L (ref 15–41)
Albumin: 3.8 g/dL (ref 3.5–5.0)
Alkaline Phosphatase: 64 U/L (ref 38–126)
Bilirubin, Direct: <0.1 mg/dL (ref 0.0–0.2)
Total Bilirubin: 0.5 mg/dL (ref 0.3–1.2)
Total Protein: 7.5 g/dL (ref 6.5–8.1)

## 2019-01-11 LAB — CBC WITH DIFFERENTIAL/PLATELET
Abs Immature Granulocytes: 0.02 10*3/uL (ref 0.00–0.07)
Basophils Absolute: 0 10*3/uL (ref 0.0–0.1)
Basophils Relative: 0 %
Eosinophils Absolute: 0.1 10*3/uL (ref 0.0–0.5)
Eosinophils Relative: 1 %
HCT: 38.3 % (ref 36.0–46.0)
Hemoglobin: 12.1 g/dL (ref 12.0–15.0)
Immature Granulocytes: 0 %
Lymphocytes Relative: 17 %
Lymphs Abs: 1.3 10*3/uL (ref 0.7–4.0)
MCH: 26.7 pg (ref 26.0–34.0)
MCHC: 31.6 g/dL (ref 30.0–36.0)
MCV: 84.5 fL (ref 80.0–100.0)
Monocytes Absolute: 0.7 10*3/uL (ref 0.1–1.0)
Monocytes Relative: 10 %
Neutro Abs: 5.3 10*3/uL (ref 1.7–7.7)
Neutrophils Relative %: 72 %
Platelets: 181 10*3/uL (ref 150–400)
RBC: 4.53 MIL/uL (ref 3.87–5.11)
RDW: 12.8 % (ref 11.5–15.5)
WBC: 7.5 10*3/uL (ref 4.0–10.5)
nRBC: 0 % (ref 0.0–0.2)

## 2019-01-11 LAB — BASIC METABOLIC PANEL
Anion gap: 9 (ref 5–15)
BUN: 11 mg/dL (ref 6–20)
CO2: 25 mmol/L (ref 22–32)
Calcium: 8.9 mg/dL (ref 8.9–10.3)
Chloride: 104 mmol/L (ref 98–111)
Creatinine, Ser: 0.53 mg/dL (ref 0.44–1.00)
GFR calc Af Amer: >60 mL/min (ref >60)
GFR calc non Af Amer: >60 mL/min (ref >60)
Glucose, Bld: 88 mg/dL (ref 70–99)
Potassium: 3.5 mmol/L (ref 3.5–5.1)
Sodium: 138 mmol/L (ref 135–145)

## 2019-01-11 LAB — CK: Total CK: 47 U/L (ref 38–234)

## 2019-01-11 LAB — TROPONIN I: Troponin I: <0.03 ng/mL (ref <0.03)

## 2019-01-11 LAB — I-STAT BETA HCG BLOOD, ED (MC, WL, AP ONLY): I-stat hCG, quantitative: <5 m[IU]/mL (ref <5)

## 2019-01-11 LAB — BRAIN NATRIURETIC PEPTIDE: B Natriuretic Peptide: 11.5 pg/mL (ref 0.0–100.0)

## 2019-01-11 MED ORDER — ONDANSETRON HCL 4 MG/2ML IJ SOLN
4.0000 mg | Freq: Once | INTRAMUSCULAR | Status: AC
  Administered 2019-01-11: 15:00:00 4 mg via INTRAVENOUS
  Filled 2019-01-11: qty 2

## 2019-01-11 MED ORDER — MORPHINE SULFATE (PF) 4 MG/ML IV SOLN
4.0000 mg | Freq: Once | INTRAVENOUS | Status: AC
  Administered 2019-01-11: 4 mg via INTRAVENOUS
  Filled 2019-01-11: qty 1

## 2019-01-11 MED ORDER — DEXAMETHASONE SODIUM PHOSPHATE 10 MG/ML IJ SOLN
10.0000 mg | Freq: Once | INTRAMUSCULAR | Status: AC
  Administered 2019-01-11: 19:00:00 10 mg via INTRAVENOUS
  Filled 2019-01-11: qty 1

## 2019-01-11 MED ORDER — KETOROLAC TROMETHAMINE 30 MG/ML IJ SOLN
30.0000 mg | Freq: Once | INTRAMUSCULAR | Status: AC
  Administered 2019-01-11: 30 mg via INTRAVENOUS
  Filled 2019-01-11: qty 1

## 2019-01-11 MED ORDER — SODIUM CHLORIDE 0.9 % IV BOLUS
1000.0000 mL | Freq: Once | INTRAVENOUS | Status: AC
  Administered 2019-01-11: 1000 mL via INTRAVENOUS

## 2019-01-11 MED ORDER — IOHEXOL 350 MG/ML SOLN
75.0000 mL | Freq: Once | INTRAVENOUS | Status: AC | PRN
  Administered 2019-01-11: 75 mL via INTRAVENOUS

## 2019-01-11 MED ORDER — HYDROCODONE-ACETAMINOPHEN 5-325 MG PO TABS: 1.0000 | ORAL_TABLET | ORAL | 0 refills | Status: DC | PRN

## 2019-01-11 MED ORDER — LORAZEPAM 2 MG/ML IJ SOLN
1.0000 mg | Freq: Once | INTRAMUSCULAR | Status: AC
  Administered 2019-01-11: 1 mg via INTRAVENOUS
  Filled 2019-01-11: qty 1

## 2019-01-11 MED ORDER — DIAZEPAM 5 MG PO TABS: 5.0000 mg | ORAL_TABLET | Freq: Two times a day (BID) | ORAL | 0 refills | Status: DC

## 2019-01-11 MED ORDER — PREDNISONE 10 MG (21) PO TBPK: ORAL_TABLET | ORAL | 0 refills | Status: DC
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
## 2019-01-11 NOTE — ED Notes (Signed)
Help get patient undress on the monitor did ekg shown to Dr Particia Nearing patient is resting with call bell in reach

## 2019-01-11 NOTE — ED Provider Notes (Signed)
[facility] EMERGENCY DEPARTMENT Provider Note   CSN: [id number] Arrival date & time: 01/11/19  1355    History   Chief Complaint Chief Complaint  Patient presents with   Chest Pain   Arm Pain    HPI Lindsey Molina is a 43 y.o. female.     Pt presents to the ED today with left arm pain and sob.  The pt said the pain started in her lower legs and moved to her arm.  She became sob at the time the pain moved.  The pt initially went to urgent care, but was sent here via EMS.  Pt speaks the [place] dialect of Arabic and information is obtained via video interpreter.     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[facility])    with translator pt is unsure if she has this   Hypotension 05/25/2012   Numbness and tingling 05/25/2012   "and pain right arm when I get mad,  nervous and/or blackout; sometimes; not always"   Respiratory arrest (HCC) 05/25/2012   "occurs when I black out; I can't breath"   Shortness of breath 05/25/2012   "occurs when I pass out"   Syncope and collapse 2011; 05/24/2012    Patient Active Problem List   Diagnosis Date Noted   Ileus, postoperative (HCC) 02/21/2016   Status post repeat low transverse cesarean section 02/18/2016   Language barrier affecting health care 08/15/2015   H/O gestational diabetes in prior pregnancy, currently pregnant 08/15/2015   Advanced maternal age in multigravida 08/15/2015   Anemia of mother in pregnancy, antepartum 07/26/2015   Ovarian cyst in pregnancy 07/26/2015   Supervision of high risk pregnancy, antepartum 07/26/2015   Previous cesarean delivery, antepartum 07/26/2015   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 Authorizing Provider  diazepam (VALIUM) 5 MG tablet Take 1 tablet (5 mg total) by mouth 2 (two) times daily. 01/11/19   Jacalyn Lefevre, MD  dicloxacillin (DYNAPEN) 500 MG capsule Take 1 capsule (500 mg total) by mouth 4 (four) times daily. 08/19/16   Bing Neighbors, FNP  HYDROcodone-acetaminophen (NORCO/VICODIN) 5-325 MG tablet Take 1 tablet by mouth every 4 (four) hours as needed. 01/11/19   Jacalyn Lefevre, MD  hydrocortisone (CORTEF) 10 MG tablet Take 10 mg by mouth daily.    [provider]  methocarbamol (ROBAXIN) 500 MG tablet Take 1 tablet (500 mg total) by mouth at bedtime as needed for muscle spasms. 11/09/17   Maczis, Elmer Sow, PA-C  naproxen (NAPROSYN) 500 MG tablet Take 1 tablet (500 mg total) by mouth 2 (two)  times daily. 11/09/17   Maczis, Elmer SowMichael M, PA-C  NON FORMULARY Take 1 tablet by mouth 2 (two) times daily. Medicine she takes for blood    [provider]  predniSONE (STERAPRED UNI-PAK 21 TAB) 10 MG (21) TBPK tablet Take 6 tabs for 2 days, then 5 for 2 days, then 4 for 2 days, then 3 for 2 days, 2 for 2 days, then 1 for 2 days 01/11/19   Jacalyn LefevreHaviland, Aadith Raudenbush, MD  Prenatal Vit-Fe Fumarate-FA (PRENATAL VITAMINS PLUS) 27-1 MG TABS Take 1 tablet by mouth daily. 11/27/15   Wouk, Wilfred CurtisNoah Bedford, MD    Family History Family History  Problem Relation Age of Onset   Alcohol abuse Neg Hx    Arthritis Neg Hx    Asthma Neg Hx    Birth defects Neg Hx    Cancer Neg Hx    COPD Neg Hx    Depression Neg Hx    Diabetes Neg Hx    Drug abuse Neg Hx    Early death Neg Hx    Hearing loss Neg Hx    Heart  disease Neg Hx    Hyperlipidemia Neg Hx    Hypertension Neg Hx    Kidney disease Neg Hx    Learning disabilities Neg Hx    Mental illness Neg Hx    Mental retardation Neg Hx    Miscarriages / Stillbirths Neg Hx    Stroke Neg Hx    Vision loss Neg Hx    Varicose Veins Neg Hx     Social History Social History   Tobacco Use   Smoking status: Never Smoker   Smokeless tobacco: Never Used  Substance Use Topics   Alcohol use: No   Drug use: No     Allergies   Patient has no known allergies.   Review of Systems Review of Systems  Respiratory: Positive for shortness of breath.   Cardiovascular: Positive for chest pain.  All other systems reviewed and are negative.    Physical Exam Updated Vital Signs BP 115/79    Pulse 84    Temp 97.7 F (36.5 C) (Oral)    Resp (!) 24    LMP 12/07/2018    SpO2 99%   Physical Exam Vitals signs and nursing note reviewed.  Constitutional:      Appearance: She is well-developed.  HENT:     Head: Normocephalic and atraumatic.  Eyes:     Extraocular Movements: Extraocular movements intact.     Pupils: Pupils are equal, round, and reactive to light.  Neck:     Musculoskeletal: Normal range of motion and neck supple.  Cardiovascular:     Rate and Rhythm: Normal rate and regular rhythm.     Heart sounds: Normal heart sounds.  Pulmonary:     Effort: Pulmonary effort is normal. Tachypnea present.     Breath sounds: Normal breath sounds.  Abdominal:     Palpations: Abdomen is soft.  Musculoskeletal: Normal range of motion.  Skin:    General: Skin is warm.     Capillary Refill: Capillary refill takes less than 2 seconds.  Neurological:     General: No focal deficit present.     Mental Status: She is alert and oriented to person, place, and time.  Psychiatric:        Mood and Affect: Mood normal.        Behavior: Behavior normal.      ED Treatments / Results  Labs (all labs ordered are listed,  but only abnormal  results are displayed) Labs Reviewed  BASIC METABOLIC PANEL  CBC WITH DIFFERENTIAL/PLATELET  BRAIN NATRIURETIC PEPTIDE  HEPATIC FUNCTION PANEL  CK  TROPONIN I  I-STAT BETA HCG BLOOD, ED ([facility], [facility], [facility] ONLY)    EKG EKG Interpretation  Date/Time:  Wednesday January 11 2019 14:04:35 EDT Ventricular Rate:  82 PR Interval:    QRS Duration: 79 QT Interval:  358 QTC Calculation: 419 R Axis:   66 Text Interpretation:  Sinus rhythm Abnormal R-wave progression, early transition Artifact in lead(s) I II aVR aVL aVF No significant change since last tracing Confirmed by Jacalyn Lefevre [phone]) on 01/11/2019 2:15:44 PM   Radiology Ct Angio Chest Pe W And/or Wo Contrast  Result Date: 01/11/2019 CLINICAL DATA:  43 year old female with left arm pain and left-sided chest pain EXAM: CT ANGIOGRAPHY CHEST WITH CONTRAST TECHNIQUE: Multidetector CT imaging of the chest was performed using the standard protocol during bolus administration of intravenous contrast. Multiplanar CT image reconstructions and MIPs were obtained to evaluate the vascular anatomy. CONTRAST:  62mL OMNIPAQUE IOHEXOL 350 MG/ML SOLN COMPARISON:  None. FINDINGS: Cardiovascular: Heart: No cardiomegaly. No pericardial fluid/thickening. No significant coronary calcifications. Aorta: Unremarkable course, caliber, contour of the thoracic aorta. No aneurysm or dissection flap. No periaortic fluid. Pulmonary arteries: No central, lobar, segmental, or proximal subsegmental filling defects. Mediastinum/Nodes: No mediastinal adenopathy. Unremarkable appearance of the thoracic esophagus. Unremarkable appearance of the thoracic inlet and thyroid. Lungs/Pleura: Geographic ground-glass opacity of the bilateral lungs. No pleural effusion. No confluent airspace disease. No pneumothorax. Upper Abdomen: No acute. Musculoskeletal: No acute displaced fracture. Degenerative changes of the spine. Review of the MIP images confirms the above findings. IMPRESSION: CT  negative for pulmonary emboli.  No acute CT finding. Geographic ground-glass opacity of the lungs, most commonly seen with small airway disease, less likely small vessel disease. Electronically Signed   By: Gilmer Mor D.O.   On: 01/11/2019 16:16   Dg Chest Port 1 View  Result Date: 01/11/2019 CLINICAL DATA:  Shortness of breath EXAM: PORTABLE CHEST 1 VIEW COMPARISON:  11/09/2017 FINDINGS: Hazy right lower lobe airspace disease which may reflect atelectasis versus pneumonia. There is no focal parenchymal opacity. There is no pleural effusion or pneumothorax. The heart and mediastinal contours are unremarkable. The osseous structures are unremarkable. IMPRESSION: 1. Hazy right lower lobe airspace disease which may reflect atelectasis versus pneumonia. Electronically Signed   By: Elige Ko   On: 01/11/2019 14:42    Procedures Procedures (including critical care time)  Medications Ordered in ED Medications  sodium chloride 0.9 % bolus 1,000 mL (0 mLs Intravenous Stopped 01/11/19 1704)  ondansetron (ZOFRAN) injection 4 mg (4 mg Intravenous Given 01/11/19 1439)  morphine 4 MG/ML injection 4 mg (4 mg Intravenous Given 01/11/19 1439)  iohexol (OMNIPAQUE) 350 MG/ML injection 75 mL (75 mLs Intravenous Contrast Given 01/11/19 1549)  LORazepam (ATIVAN) injection 1 mg (1 mg Intravenous Given 01/11/19 1654)  ketorolac (TORADOL) 30 MG/ML injection 30 mg (30 mg Intravenous Given 01/11/19 1837)  dexamethasone (DECADRON) injection 10 mg (10 mg Intravenous Given 01/11/19 1838)     Initial Impression / Assessment and Plan / ED Course  I have reviewed the triage vital signs and the nursing notes.  Pertinent labs & imaging results that were available during my care of the patient were reviewed by me and considered in my medical decision making (see chart for details).       Pt is feeling better, but is now describing pain radiating down  her left arm and into her head.  She may have cervical radiculopathy.     No source of sob, but she does have GG opacities on CT.  No cough or URI sx.  The pt does not have a PE.  No fever here.  O2 sat 100% on RA.  Pt instructed to f/u with pcp.  All information d/w a [place] translator via video or phone.   Return if worse.  Final Clinical Impressions(s) / ED Diagnoses   Final diagnoses:  Cervical radiculopathy  Dyspnea, unspecified type    ED Discharge Orders         Ordered    predniSONE (STERAPRED UNI-PAK 21 TAB) 10 MG (21) TBPK tablet     01/11/19 1833    HYDROcodone-acetaminophen (NORCO/VICODIN) 5-325 MG tablet  Every 4 hours PRN     01/11/19 1833    diazepam (VALIUM) 5 MG tablet  2 times daily     01/11/19 1833           Jacalyn Lefevre, MD 01/11/19 [place]

## 2019-01-11 NOTE — ED Notes (Signed)
Patient verbalizes understanding of discharge instructions . Opportunity for questions and answers were provided . Armband removed by staff ,Pt discharged from ED. W/C  offered at D/C  and Declined W/C at D/C and was escorted to lobby by RN.  

## 2020-11-15 ENCOUNTER — Other Ambulatory Visit: Payer: Self-pay | Admitting: Obstetrics and Gynecology

## 2021-04-28 ENCOUNTER — Encounter (HOSPITAL_COMMUNITY): Payer: Self-pay | Admitting: Emergency Medicine

## 2021-04-28 ENCOUNTER — Other Ambulatory Visit: Payer: Self-pay

## 2021-04-28 ENCOUNTER — Ambulatory Visit (HOSPITAL_COMMUNITY)
Admission: EM | Admit: 2021-04-28 | Discharge: 2021-04-28 | Disposition: A | Discharge status: Home / Self Care | Payer: 59 | Attending: Internal Medicine | Admitting: Internal Medicine

## 2021-04-28 DIAGNOSIS — M25511 Pain in right shoulder: Secondary | ICD-10-CM | POA: Diagnosis not present

## 2021-04-28 LAB — CBC WITH DIFFERENTIAL/PLATELET
Abs Immature Granulocytes: 0.01 10*3/uL (ref 0.00–0.07)
Basophils Absolute: 0 10*3/uL (ref 0.0–0.1)
Basophils Relative: 0 %
Eosinophils Absolute: 0.1 10*3/uL (ref 0.0–0.5)
Eosinophils Relative: 3 %
HCT: 39.4 % (ref 36.0–46.0)
Hemoglobin: 12.7 g/dL (ref 12.0–15.0)
Immature Granulocytes: 0 %
Lymphocytes Relative: 50 %
Lymphs Abs: 1.9 10*3/uL (ref 0.7–4.0)
MCH: 27.1 pg (ref 26.0–34.0)
MCHC: 32.2 g/dL (ref 30.0–36.0)
MCV: 84 fL (ref 80.0–100.0)
Monocytes Absolute: 0.5 10*3/uL (ref 0.1–1.0)
Monocytes Relative: 13 %
Neutro Abs: 1.3 10*3/uL — ABNORMAL LOW (ref 1.7–7.7)
Neutrophils Relative %: 34 %
Platelets: 171 10*3/uL (ref 150–400)
RBC: 4.69 MIL/uL (ref 3.87–5.11)
RDW: 12.9 % (ref 11.5–15.5)
WBC: 3.7 10*3/uL — ABNORMAL LOW (ref 4.0–10.5)
nRBC: 0 % (ref 0.0–0.2)

## 2021-04-28 LAB — SEDIMENTATION RATE: Sed Rate: 25 mm/hr — ABNORMAL HIGH (ref 0–22)

## 2021-04-28 LAB — C-REACTIVE PROTEIN: CRP: 3.9 mg/dL — ABNORMAL HIGH (ref <1.0)

## 2021-04-28 MED ORDER — METHYLPREDNISOLONE SODIUM SUCC 125 MG IJ SOLR
INTRAMUSCULAR | Status: AC
  Filled 2021-04-28: qty 2

## 2021-04-28 MED ORDER — METHYLPREDNISOLONE SODIUM SUCC 125 MG IJ SOLR
60.0000 mg | Freq: Once | INTRAMUSCULAR | Status: AC
  Administered 2021-04-28: 60 mg via INTRAMUSCULAR

## 2021-04-28 NOTE — Discharge Instructions (Addendum)
Please continue your pain medication regimen Gentle range of motion exercises We will call you with recommendations if labs are abnormal. Return to urgent care if symptoms worsen.

## 2021-04-28 NOTE — ED Triage Notes (Signed)
Pt presents with left shoulder pain xs 2 years. States has pain in arm, leg, and shoulder at times. Denies any fall or injury. States has been prescribed hydrocodone in the past for the pain. States took the rx prescribed in 2020 this am.

## 2021-04-29 NOTE — ED Provider Notes (Signed)
[place]    CSN: [id number] Arrival date & time: 04/28/21  1117      History   Chief Complaint Chief Complaint  Patient presents with   Shoulder Pain    Left    HPI Ezinne Yogi is a 45 y.o. female comes to the urgent care with recurrent left shoulder pain of 2 years duration.  Patient says symptoms started a couple of years ago and has been recurrent.  She is currently on hydrocodone for the pain.  She also complains of pain in both knees and both ankles as well as elbows.  These joint pains are recurrent.  No trauma or falls.  No known relieving factors.  Hydrocodone relieves it somewhat.  No rash on the skin.  No fever or chills.   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[facility])    with translator pt is unsure if she has this   Hypotension 05/25/2012   Numbness and tingling 05/25/2012   "and pain right arm when I get mad,  nervous and/or blackout; sometimes; not always"   Respiratory arrest ([place]) 05/25/2012   "occurs when I black out; I can't breath"   Shortness of breath 05/25/2012   "occurs when I pass out"   Syncope and collapse 2011; 05/24/2012    Patient Active Problem List   Diagnosis Date Noted   Ileus, postoperative ([place]) 02/21/2016   Status post repeat low transverse cesarean section 02/18/2016   Language barrier affecting health care 08/15/2015   H/O gestational diabetes in prior pregnancy, currently pregnant 08/15/2015   Advanced maternal age in multigravida 08/15/2015   Anemia of mother in pregnancy, antepartum 07/26/2015   Ovarian cyst in pregnancy 07/26/2015   Supervision of high risk pregnancy, antepartum 07/26/2015   Previous cesarean delivery, antepartum 07/26/2015   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 Authorizing Provider  diazepam (VALIUM) 5 MG tablet Take 1 tablet (5 mg total) by mouth 2 (two) times daily. 01/11/19   Isla Pence, MD  HYDROcodone-acetaminophen (NORCO/VICODIN) 5-325 MG tablet Take 1 tablet by mouth every 4 (four) hours as needed. 01/11/19   Isla Pence, MD  hydrocortisone (CORTEF) 10 MG tablet Take 10 mg by mouth daily.    [provider]  methocarbamol (ROBAXIN) 500 MG tablet Take 1 tablet (500 mg total) by mouth at bedtime as needed for muscle spasms. 11/09/17   Maczis, Barth Kirks, PA-C  naproxen (NAPROSYN) 500 MG tablet Take 1 tablet (500 mg total) by mouth 2 (two) times daily. 11/09/17   Maczis, Barth Kirks, PA-C  NON FORMULARY  Take 1 tablet by mouth 2 (two) times daily. Medicine she takes for blood    [provider]  Prenatal Vit-Fe Fumarate-FA (PRENATAL VITAMINS PLUS) 27-1 MG TABS Take 1 tablet by mouth daily. 11/27/15   Wouk, Ailene Rud, MD    Family History Family History  Problem Relation Age of Onset   Alcohol abuse Neg Hx    Arthritis Neg Hx    Asthma Neg Hx    Birth defects Neg Hx    Cancer Neg Hx    COPD Neg Hx    Depression Neg Hx    Diabetes Neg Hx    Drug abuse Neg Hx    Early death Neg Hx    Hearing loss Neg Hx    Heart disease Neg Hx    Hyperlipidemia Neg Hx    Hypertension Neg Hx    Kidney disease Neg Hx    Learning disabilities Neg Hx    Mental illness Neg Hx    Mental retardation Neg Hx    Miscarriages / Stillbirths Neg Hx    Stroke Neg Hx    Vision loss Neg Hx    Varicose Veins Neg Hx     Social  History Social History   Tobacco Use   Smoking status: Never   Smokeless tobacco: Never  Vaping Use   Vaping Use: Never used  Substance Use Topics   Alcohol use: No   Drug use: No     Allergies   Patient has no known allergies.   Review of Systems Review of Systems  Constitutional: Negative.   Eyes: Negative.   Gastrointestinal: Negative.   Genitourinary: Negative.   Musculoskeletal:  Positive for arthralgias. Negative for gait problem and joint swelling.    Physical Exam Triage Vital Signs ED Triage Vitals  Enc Vitals Group     BP 04/28/21 1250 116/76     Pulse Rate 04/28/21 1250 76     Resp 04/28/21 1250 17     Temp 04/28/21 1250 98.5 F (36.9 C)     Temp Source 04/28/21 1250 Oral     SpO2 04/28/21 1250 96 %     Weight --      Height --      Head Circumference --      Peak Flow --      Pain Score 04/28/21 1247 3     Pain Loc --      Pain Edu? --      Excl. in [place]? --    No data found.  Updated Vital Signs BP 116/76 (BP Location: Left Arm)   Pulse 76   Temp 98.5 F (36.9 C) (Oral)   Resp 17   SpO2 96%   Visual Acuity Right Eye Distance:   Left Eye Distance:   Bilateral Distance:    Right Eye Near:   Left Eye Near:    Bilateral Near:     Physical Exam Vitals and nursing note reviewed.  Constitutional:      General: She is in acute distress.     Appearance: She is not ill-appearing.  HENT:     Right Ear: Tympanic membrane normal.     Left Ear: Tympanic membrane normal.  Cardiovascular:     Rate and Rhythm: Normal rate and regular rhythm.  Musculoskeletal:     Comments: Palpation over the left latissimus dorsi is tender.  She has some left costochondritis.  Range of motion around the left elbow is full with mild pain.  No bruising.  No rash.  Neurological:     Mental Status: She is alert.     UC Treatments / Results  Labs (all labs ordered are listed, but only abnormal results are displayed) Labs Reviewed  CBC WITH  DIFFERENTIAL/PLATELET - Abnormal; Notable for the following components:      Result Value   WBC 3.7 (*)    Neutro Abs 1.3 (*)    All other components within normal limits  SEDIMENTATION RATE - Abnormal; Notable for the following components:   Sed Rate 25 (*)    All other components within normal limits  C-REACTIVE PROTEIN - Abnormal; Notable for the following components:   CRP 3.9 (*)    All other components within normal limits  ANTINUCLEAR ANTIBODIES, IFA    EKG   Radiology No results found.  Procedures Procedures (including critical care time)  Medications Ordered in UC Medications  methylPREDNISolone sodium succinate (SOLU-MEDROL) 125 mg/2 mL injection 60 mg (60 mg Intramuscular Given 04/28/21 1347)    Initial Impression / Assessment and Plan / UC Course  I have reviewed the triage vital signs and the nursing notes.  Pertinent labs & imaging results that were available during my care of the patient were reviewed by me and considered in my medical decision making (see chart for details).     1.  Recurrent shoulder pain: Shoulder pain is part of a clinical presentation of recurrent joint pain.  I suspect that this is a rheumatologic disease.  I will order a BMP, CBC, ESR, CRP and ANA.  If the labs are significant patient will need a rheumatology evaluation.  Patient is advised to return to urgent care if symptoms worsen.  She was given 60 mg of Solu-Medrol IM x1 dose. Final Clinical Impressions(s) / UC Diagnoses   Final diagnoses:  Acute pain of right shoulder     Discharge Instructions      Please continue your pain medication regimen Gentle range of motion exercises We will call you with recommendations if labs are abnormal. Return to urgent care if symptoms worsen.     ED Prescriptions   None    PDMP not reviewed this encounter.   Chase Picket, MD 04/29/21 [phone]

## 2021-05-02 LAB — ANTINUCLEAR ANTIBODIES, IFA: ANA Ab, IFA: NEGATIVE

## 2021-12-16 ENCOUNTER — Ambulatory Visit
Admission: RE | Admit: 2021-12-16 | Discharge: 2021-12-16 | Disposition: A | Discharge status: Home / Self Care | Payer: No Typology Code available for payment source | Source: Ambulatory Visit | Attending: Internal Medicine | Admitting: Internal Medicine

## 2021-12-16 ENCOUNTER — Other Ambulatory Visit: Payer: Self-pay | Admitting: Internal Medicine

## 2021-12-16 DIAGNOSIS — M79671 Pain in right foot: Secondary | ICD-10-CM

## 2022-01-06 ENCOUNTER — Telehealth: Payer: Self-pay | Admitting: Internal Medicine

## 2022-01-06 NOTE — Telephone Encounter (Signed)
Scheduled appt per 4/10 staff msg from Dr. Arbutus Ped. Pt's husband is aware of appt date and time. Pt's husband is aware to arrive 15 mins prior to appt time and to bring and updated insurance card. Pt's husband is aware of appt location.   ?

## 2022-01-06 NOTE — Telephone Encounter (Signed)
Called pt to sch new hem appt per 4/10 staff msg from Dr. Arbutus Ped. No answer. Left msg for pt to call back to sch appt.  ?

## 2022-01-16 ENCOUNTER — Other Ambulatory Visit: Payer: Self-pay

## 2022-01-16 DIAGNOSIS — D649 Anemia, unspecified: Secondary | ICD-10-CM

## 2022-01-19 ENCOUNTER — Other Ambulatory Visit: Payer: Self-pay | Admitting: Internal Medicine

## 2022-01-19 DIAGNOSIS — D539 Nutritional anemia, unspecified: Secondary | ICD-10-CM

## 2022-01-20 ENCOUNTER — Inpatient Hospital Stay: Payer: 59

## 2022-01-20 ENCOUNTER — Other Ambulatory Visit: Payer: Self-pay | Admitting: Internal Medicine

## 2022-01-20 ENCOUNTER — Other Ambulatory Visit: Payer: Self-pay

## 2022-01-20 ENCOUNTER — Encounter: Payer: Self-pay | Admitting: Internal Medicine

## 2022-01-20 ENCOUNTER — Inpatient Hospital Stay: Payer: 59 | Attending: Internal Medicine | Admitting: Internal Medicine

## 2022-01-20 DIAGNOSIS — D72819 Decreased white blood cell count, unspecified: Secondary | ICD-10-CM | POA: Diagnosis not present

## 2022-01-20 DIAGNOSIS — D708 Other neutropenia: Secondary | ICD-10-CM

## 2022-01-20 DIAGNOSIS — D539 Nutritional anemia, unspecified: Secondary | ICD-10-CM

## 2022-01-20 DIAGNOSIS — F419 Anxiety disorder, unspecified: Secondary | ICD-10-CM | POA: Insufficient documentation

## 2022-01-20 DIAGNOSIS — D649 Anemia, unspecified: Secondary | ICD-10-CM | POA: Insufficient documentation

## 2022-01-20 DIAGNOSIS — F32A Depression, unspecified: Secondary | ICD-10-CM | POA: Insufficient documentation

## 2022-01-20 LAB — CBC WITH DIFFERENTIAL (CANCER CENTER ONLY)
Abs Immature Granulocytes: 0.01 10*3/uL (ref 0.00–0.07)
Basophils Absolute: 0 10*3/uL (ref 0.0–0.1)
Basophils Relative: 0 %
Eosinophils Absolute: 0.1 10*3/uL (ref 0.0–0.5)
Eosinophils Relative: 3 %
HCT: 37.4 % (ref 36.0–46.0)
Hemoglobin: 12.5 g/dL (ref 12.0–15.0)
Immature Granulocytes: 0 %
Lymphocytes Relative: 48 %
Lymphs Abs: 1.7 10*3/uL (ref 0.7–4.0)
MCH: 27.7 pg (ref 26.0–34.0)
MCHC: 33.4 g/dL (ref 30.0–36.0)
MCV: 82.9 fL (ref 80.0–100.0)
Monocytes Absolute: 0.3 10*3/uL (ref 0.1–1.0)
Monocytes Relative: 7 %
Neutro Abs: 1.5 10*3/uL — ABNORMAL LOW (ref 1.7–7.7)
Neutrophils Relative %: 42 %
Platelet Count: 182 10*3/uL (ref 150–400)
RBC: 4.51 MIL/uL (ref 3.87–5.11)
RDW: 13.6 % (ref 11.5–15.5)
WBC Count: 3.5 10*3/uL — ABNORMAL LOW (ref 4.0–10.5)
nRBC: 0 % (ref 0.0–0.2)

## 2022-01-20 LAB — CMP (CANCER CENTER ONLY)
ALT: 17 U/L (ref 0–44)
AST: 19 U/L (ref 15–41)
Albumin: 4.1 g/dL (ref 3.5–5.0)
Alkaline Phosphatase: 75 U/L (ref 38–126)
Anion gap: 5 (ref 5–15)
BUN: 10 mg/dL (ref 6–20)
CO2: 28 mmol/L (ref 22–32)
Calcium: 8.7 mg/dL — ABNORMAL LOW (ref 8.9–10.3)
Chloride: 106 mmol/L (ref 98–111)
Creatinine: 0.56 mg/dL (ref 0.44–1.00)
GFR, Estimated: >60 mL/min (ref >60)
Glucose, Bld: 134 mg/dL — ABNORMAL HIGH (ref 70–99)
Potassium: 3.7 mmol/L (ref 3.5–5.1)
Sodium: 139 mmol/L (ref 135–145)
Total Bilirubin: 0.5 mg/dL (ref 0.3–1.2)
Total Protein: 7.6 g/dL (ref 6.5–8.1)

## 2022-01-20 LAB — LACTATE DEHYDROGENASE: LDH: 132 U/L (ref 98–192)

## 2022-01-20 LAB — IRON AND IRON BINDING CAPACITY (CC-WL,HP ONLY)
Iron: 65 ug/dL (ref 28–170)
Saturation Ratios: 14 % (ref 10.4–31.8)
TIBC: 466 ug/dL — ABNORMAL HIGH (ref 250–450)
UIBC: 401 ug/dL (ref 148–442)

## 2022-01-20 LAB — FOLATE: Folate: 19.2 ng/mL (ref >5.9)

## 2022-01-20 LAB — VITAMIN B12: Vitamin B-12: 368 pg/mL (ref 180–914)

## 2022-01-20 LAB — FERRITIN: Ferritin: 10 ng/mL — ABNORMAL LOW (ref 11–307)

## 2022-01-20 MED ORDER — INTEGRA PLUS PO CAPS: 1.0000 | ORAL_CAPSULE | Freq: Every day | ORAL | 4 refills | Status: DC

## 2022-01-20 NOTE — Progress Notes (Signed)
? ? [facility] ?[phone]   [phone] ? ?CONSULT NOTE ? ?REFERRING PHYSICIAN: Talmage CoinJeffrey Molina ? ?REASON FOR CONSULTATION:  ?46 years old white female with history of anemia. ? ?HPI ?Lindsey Molina is a 46 y.o. female with past medical history significant for depression, hypopituitarism as well as adrenal insufficiency and currently on treatment with Cortef by Dr. Sharl Molina.  The patient had several syncopal episodes in the past with a lot of pain in the upper and lower extremities and neuropathy.  She used to take Norco in the past but not recently.  She also was given prescription for diazepam for her anxiety issues but she has not taken it recently.  She gets nervous very quickly with a lot of tension and pain in the upper and lower extremities followed by some fainting attacks.  She has not seen by psychiatry in the past.  She was seen recently by Dr. Sharl Molina and because of her history of anemia and mild leukocytopenia she was referred to me today for evaluation and recommendation regarding her condition.  I have seen the patient in the past more than 11 years ago for anemia during her pregnancy and that improved.  When seen today she continues to complain of anxiety issue as well as the numbness in the upper and lower extremities.  She denied having any chest pain, shortness of breath, cough or hemoptysis.  She denied having any nausea, vomiting, diarrhea but has intermittent constipation.  She also has these intermittent pains in the upper and lower extremities that comes and attacks. ?Family history unremarkable for any medical issues in her father or mother. ?The patient is married and has 3 children aged 626, 6111 and 4817.  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  She has no history of smoking, alcohol or drug abuse.  She was accompanied today by her husband. ? ?HPI ? ?Past Medical History:  ?Diagnosis Date  ? Depression   ? current pregnancy  ? Dizziness 05/25/2012  ? "severe; and I pass out"  ?  Hypopituitarism (HCC)   ? with translator pt is unsure if she has this  ? Hypotension 05/25/2012  ? Numbness and tingling 05/25/2012  ? "and pain right arm when I get mad,  nervous and/or blackout; sometimes; not always"  ? Respiratory arrest (HCC) 05/25/2012  ? "occurs when I black out; I can't breath"  ? Shortness of breath 05/25/2012  ? "occurs when I pass out"  ? Syncope and collapse 2011; 05/24/2012  ? ? ?Past Surgical History:  ?Procedure Laterality Date  ? CESAREAN SECTION  2005; 2011  ? CESAREAN SECTION N/A 02/18/2016  ? Procedure: REPEAT CESAREAN SECTION;  Surgeon: Lindsey Boresanya S Pratt, MD;  [facility];  Service: Obstetrics;  Laterality: N/A;  ? ? ?Family History  ?Problem Relation Age of Onset  ? Alcohol abuse Neg Hx   ? Arthritis Neg Hx   ? Asthma Neg Hx   ? Birth defects Neg Hx   ? Cancer Neg Hx   ? COPD Neg Hx   ? Depression Neg Hx   ? Diabetes Neg Hx   ? Drug abuse Neg Hx   ? Early death Neg Hx   ? Hearing loss Neg Hx   ? Heart disease Neg Hx   ? Hyperlipidemia Neg Hx   ? Hypertension Neg Hx   ? Kidney disease Neg Hx   ? Learning disabilities Neg Hx   ? Mental illness Neg Hx   ? Mental retardation Neg  Hx   ? Miscarriages / Stillbirths Neg Hx   ? Stroke Neg Hx   ? Vision loss Neg Hx   ? Varicose Veins Neg Hx   ? ? ?Social History ?Social History  ? ?Tobacco Use  ? Smoking status: Never  ? Smokeless tobacco: Never  ?Vaping Use  ? Vaping Use: Never used  ?Substance Use Topics  ? Alcohol use: No  ? Drug use: No  ? ? ?No Known Allergies ? ?Current Outpatient Medications  ?Medication Sig Dispense Refill  ? diazepam (VALIUM) 5 MG tablet Take 1 tablet (5 mg total) by mouth 2 (two) times daily. 10 tablet 0  ? HYDROcodone-acetaminophen (NORCO/VICODIN) 5-325 MG tablet Take 1 tablet by mouth every 4 (four) hours as needed. 10 tablet 0  ? hydrocortisone (CORTEF) 10 MG tablet Take 10 mg by mouth daily.    ? methocarbamol (ROBAXIN) 500 MG tablet Take 1 tablet (500 mg total) by mouth at bedtime as needed for  muscle spasms. 15 tablet 0  ? naproxen (NAPROSYN) 500 MG tablet Take 1 tablet (500 mg total) by mouth 2 (two) times daily. 14 tablet 0  ? NON FORMULARY Take 1 tablet by mouth 2 (two) times daily. Medicine she takes for blood    ? Prenatal Vit-Fe Fumarate-FA (PRENATAL VITAMINS PLUS) 27-1 MG TABS Take 1 tablet by mouth daily. 30 tablet 11  ? ?No current facility-administered medications for this visit.  ? ? ?Review of Systems ? ?Constitutional: positive for fatigue ?Eyes: negative ?Ears, nose, mouth, throat, and face: negative ?Respiratory: negative ?Cardiovascular: negative ?Gastrointestinal: positive for constipation ?Genitourinary:negative ?Integument/breast: negative ?Hematologic/lymphatic: negative ?Musculoskeletal:positive for myalgias ?Neurological: positive for paresthesia ?Behavioral/Psych: positive for anxiety and depression ?Endocrine: negative ?Allergic/Immunologic: negative ? ?Physical Exam ? ?QMV:HQION, healthy, no distress, well nourished, well developed, and anxious ?SKIN: skin color, texture, turgor are normal, no rashes or significant lesions ?HEAD: Normocephalic, No masses, lesions, tenderness or abnormalities ?EYES: normal, PERRLA, Conjunctiva are pink and non-injected ?EARS: External ears normal, Canals clear ?OROPHARYNX:no exudate, no erythema, and lips, buccal mucosa, and tongue normal  ?NECK: supple, no adenopathy, no JVD ?LYMPH:  no palpable lymphadenopathy, no hepatosplenomegaly ?BREAST:not examined ?LUNGS: clear to auscultation , and palpation ?HEART: regular rate & rhythm, no murmurs, and no gallops ?ABDOMEN:abdomen soft, non-tender, normal bowel sounds, and no masses or organomegaly ?BACK: Back symmetric, no curvature., No CVA tenderness ?EXTREMITIES:no joint deformities, effusion, or inflammation, no edema  ?NEURO: alert & oriented x 3 with fluent speech, no focal motor/sensory deficits ? ?PERFORMANCE STATUS: ECOG 1 ? ?LABORATORY DATA: ?Lab Results  ?Component Value Date  ? WBC 3.5 (L)  01/20/2022  ? HGB 12.5 01/20/2022  ? HCT 37.4 01/20/2022  ? MCV 82.9 01/20/2022  ? PLT 182 01/20/2022  ? ? ?  Chemistry   ?   ?Component Value Date/Time  ? NA 139 01/20/2022 1022  ? K 3.7 01/20/2022 1022  ? CL 106 01/20/2022 1022  ? CO2 28 01/20/2022 1022  ? BUN 10 01/20/2022 1022  ? CREATININE 0.56 01/20/2022 1022  ? CREATININE 0.38 (L) 09/11/2015 1007  ?    ?Component Value Date/Time  ? CALCIUM 8.7 (L) 01/20/2022 1022  ? ALKPHOS 75 01/20/2022 1022  ? AST 19 01/20/2022 1022  ? ALT 17 01/20/2022 1022  ? BILITOT 0.5 01/20/2022 1022  ?  ? ? ? ?RADIOGRAPHIC STUDIES: ?No results found. ? ?ASSESSMENT: This is a very pleasant 46 years old white female who presented for evaluation of anemia and mild leukocytopenia.  The patient has  significant history of anxiety/depression with attacks of shaking and pain in the upper and lower extremities followed by fainting. ?She has a history of hypopituitarism and followed by Dr. Sharl Ma. ? ?PLAN: I had a lengthy discussion with the patient and her husband today about her current condition and the recent lab results. ?Repeat CBC today showed mild leukocytopenia with total white blood count of 3.5 but the patient has no anemia or thrombocytopenia.  Comprehensive metabolic panel is unremarkable except for mild hyperglycemia but this was not fasting.  She also has mild hypocalcemia with calcium of 8.7.  The patient has normal vitamin B12 level and serum folate.  Her LDH is normal.  Ferritin level is still pending her iron studies unremarkable for any abnormality except for elevated TIBC ?Serum protein electrophoresis with immunofixation is still pending. ?I assured the patient that there is no clear underlying hematologic abnormality to explain her fainting issues or the pain in her upper and lower extremities. ?I recommended for the patient to continue her routine follow-up visit and evaluation by endocrinology.  She may also need referral to psychiatry for evaluation and management of her  anxiety and depression issues. ?I will see the patient on as-needed basis at this point.   ?She was advised to call if she has any concerning issues in the interval. ?The patient voices understanding of current d

## 2022-01-22 LAB — PROTEIN ELECTROPHORESIS, SERUM, WITH REFLEX
A/G Ratio: 1.1 (ref 0.7–1.7)
Albumin ELP: 3.7 g/dL (ref 2.9–4.4)
Alpha-1-Globulin: 0.2 g/dL (ref 0.0–0.4)
Alpha-2-Globulin: 0.6 g/dL (ref 0.4–1.0)
Beta Globulin: 1 g/dL (ref 0.7–1.3)
Gamma Globulin: 1.5 g/dL (ref 0.4–1.8)
Globulin, Total: 3.3 g/dL (ref 2.2–3.9)
Total Protein ELP: 7 g/dL (ref 6.0–8.5)

## 2023-02-20 ENCOUNTER — Other Ambulatory Visit: Payer: Self-pay

## 2023-02-20 ENCOUNTER — Emergency Department (HOSPITAL_COMMUNITY): Payer: BLUE CROSS/BLUE SHIELD

## 2023-02-20 ENCOUNTER — Emergency Department (HOSPITAL_COMMUNITY)
Admission: EM | Admit: 2023-02-20 | Discharge: 2023-02-20 | Disposition: A | Discharge status: Home / Self Care | Payer: BLUE CROSS/BLUE SHIELD | Attending: Emergency Medicine | Admitting: Emergency Medicine

## 2023-02-20 ENCOUNTER — Encounter (HOSPITAL_COMMUNITY): Payer: Self-pay | Admitting: Pharmacy Technician

## 2023-02-20 DIAGNOSIS — R8281 Pyuria: Secondary | ICD-10-CM | POA: Insufficient documentation

## 2023-02-20 DIAGNOSIS — M5431 Sciatica, right side: Secondary | ICD-10-CM | POA: Diagnosis not present

## 2023-02-20 DIAGNOSIS — M79604 Pain in right leg: Secondary | ICD-10-CM | POA: Diagnosis present

## 2023-02-20 LAB — CBC WITH DIFFERENTIAL/PLATELET
Abs Immature Granulocytes: 0.01 10*3/uL (ref 0.00–0.07)
Basophils Absolute: 0 10*3/uL (ref 0.0–0.1)
Basophils Relative: 0 %
Eosinophils Absolute: 0.1 10*3/uL (ref 0.0–0.5)
Eosinophils Relative: 1 %
HCT: 39.2 % (ref 36.0–46.0)
Hemoglobin: 12.7 g/dL (ref 12.0–15.0)
Immature Granulocytes: 0 %
Lymphocytes Relative: 25 %
Lymphs Abs: 1.5 10*3/uL (ref 0.7–4.0)
MCH: 27.1 pg (ref 26.0–34.0)
MCHC: 32.4 g/dL (ref 30.0–36.0)
MCV: 83.6 fL (ref 80.0–100.0)
Monocytes Absolute: 0.5 10*3/uL (ref 0.1–1.0)
Monocytes Relative: 9 %
Neutro Abs: 4 10*3/uL (ref 1.7–7.7)
Neutrophils Relative %: 65 %
Platelets: 188 10*3/uL (ref 150–400)
RBC: 4.69 MIL/uL (ref 3.87–5.11)
RDW: 13 % (ref 11.5–15.5)
WBC: 6.1 10*3/uL (ref 4.0–10.5)
nRBC: 0 % (ref 0.0–0.2)

## 2023-02-20 LAB — I-STAT BETA HCG BLOOD, ED (MC, WL, AP ONLY): I-stat hCG, quantitative: <5 m[IU]/mL (ref <5)

## 2023-02-20 LAB — BASIC METABOLIC PANEL
Anion gap: 10 (ref 5–15)
BUN: 8 mg/dL (ref 6–20)
CO2: 24 mmol/L (ref 22–32)
Calcium: 9.1 mg/dL (ref 8.9–10.3)
Chloride: 105 mmol/L (ref 98–111)
Creatinine, Ser: 0.54 mg/dL (ref 0.44–1.00)
GFR, Estimated: >60 mL/min (ref >60)
Glucose, Bld: 94 mg/dL (ref 70–99)
Potassium: 3.8 mmol/L (ref 3.5–5.1)
Sodium: 139 mmol/L (ref 135–145)

## 2023-02-20 LAB — URINALYSIS, ROUTINE W REFLEX MICROSCOPIC
Bilirubin Urine: NEGATIVE
Glucose, UA: NEGATIVE mg/dL
Ketones, ur: NEGATIVE mg/dL
Nitrite: NEGATIVE
Protein, ur: NEGATIVE mg/dL
Specific Gravity, Urine: 1.012 (ref 1.005–1.030)
WBC, UA: >50 WBC/hpf (ref 0–5)
pH: 7 (ref 5.0–8.0)

## 2023-02-20 LAB — TROPONIN I (HIGH SENSITIVITY): Troponin I (High Sensitivity): 3 ng/L (ref <18)

## 2023-02-20 LAB — CK: Total CK: 57 U/L (ref 38–234)

## 2023-02-20 MED ORDER — METHOCARBAMOL 500 MG PO TABS: 500.0000 mg | ORAL_TABLET | Freq: Three times a day (TID) | ORAL | 0 refills | Status: DC | PRN

## 2023-02-20 MED ORDER — HYDROCODONE-ACETAMINOPHEN 5-325 MG PO TABS
1.0000 | ORAL_TABLET | Freq: Once | ORAL | Status: AC
  Administered 2023-02-20: 1 via ORAL
  Filled 2023-02-20: qty 1

## 2023-02-20 MED ORDER — DEXAMETHASONE SODIUM PHOSPHATE 10 MG/ML IJ SOLN
10.0000 mg | Freq: Once | INTRAMUSCULAR | Status: AC
Start: 2023-02-20 — End: 2023-02-20
  Administered 2023-02-20: 10 mg via INTRAVENOUS
  Filled 2023-02-20: qty 1

## 2023-02-20 NOTE — ED Notes (Signed)
Lab called and urine culture to be added to current urine sample

## 2023-02-20 NOTE — Discharge Instructions (Addendum)
Your MRI is reassuring.  Does not show any spinal cord compression or spinal nerve pathology.  Continue your steroids and take the muscle relaxers as prescribed.  Follow-up with your doctor.  Return to the ED with worsening pain, weakness, numbness, tingling, bowel or bladder incontinence or any other concerns.

## 2023-02-20 NOTE — ED Triage Notes (Signed)
Via interpreter [phone]: Pt reports pain to leg from the waist down to the toes. Pain onset yesterday afternoon. Denies trauma or injury.

## 2023-02-20 NOTE — ED Provider Notes (Signed)
[facility] AT [facility] Provider Note   CSN: [id number] Arrival date & time: 02/20/23  1537     History  Chief Complaint  Patient presents with   Leg Pain    Lindsey Molina is a 47 y.o. female.  past medical history significant for depression, hypopituitarism as well as adrenal insufficiency and currently on treatment with Cortef by Dr. Sharl Ma. Level 5 caveat for language barrier, translator is used.  Husband at bedside speaks different dialect of Arabic than interpreter but is able to understand some.  Patient complains of right leg pain starting from her hip rating down to her knee.  Pain ongoing for the past 1 day.  Denies any fall or trauma.  Describes pain as well as numbness and tingling involving her right hip lateral leg to her knee but does not extend past the knee.  Associate with some numbness and tingling.  No weakness.  No back pain.  No bowel or bladder incontinence.  No fever, chills, nausea, vomiting.  No chest pain or shortness of breath.  Last week she was having some pain in her right neck with assistance resolved.  She occasionally gets right neck pain that goes down to her right arm but none currently.  Did not take any medication at home for the pain.  No previous neck or back surgeries.  No history of IV drug abuse or cancer. No history of hormone use or blood thinner use.  The history is provided by the patient and the spouse. The history is limited by a language barrier. A language interpreter was used.  Leg Pain Associated symptoms: no back pain and no fever        Home Medications Prior to Admission medications   Medication Sig Start Date End Date Taking? Authorizing Provider  diazepam (VALIUM) 5 MG tablet Take 1 tablet (5 mg total) by mouth 2 (two) times daily. 01/11/19   Jacalyn Lefevre, MD  FeFum-FePoly-FA-B Cmp-C-Biot (INTEGRA PLUS) CAPS Take 1 capsule by mouth daily. 01/20/22   Si Gaul, MD   HYDROcodone-acetaminophen (NORCO/VICODIN) 5-325 MG tablet Take 1 tablet by mouth every 4 (four) hours as needed. 01/11/19   Jacalyn Lefevre, MD  hydrocortisone (CORTEF) 10 MG tablet Take 10 mg by mouth daily.    [provider]  methocarbamol (ROBAXIN) 500 MG tablet Take 1 tablet (500 mg total) by mouth at bedtime as needed for muscle spasms. 11/09/17   Maczis, Elmer Sow, PA-C  naproxen (NAPROSYN) 500 MG tablet Take 1 tablet (500 mg total) by mouth 2 (two) times daily. 11/09/17   Maczis, Elmer Sow, PA-C  NON FORMULARY Take 1 tablet by mouth 2 (two) times daily. Medicine she takes for blood    [provider]      Allergies    Patient has no known allergies.    Review of Systems   Review of Systems  Constitutional:  Negative for activity change, appetite change and fever.  HENT:  Negative for congestion and sinus pressure.   Respiratory:  Negative for cough, chest tightness and shortness of breath.   Cardiovascular:  Negative for chest pain.  Gastrointestinal:  Negative for abdominal pain, nausea and vomiting.  Genitourinary:  Negative for dysuria and hematuria.  Musculoskeletal:  Positive for arthralgias and myalgias. Negative for back pain.  Skin:  Negative for rash.  Neurological:  Negative for dizziness, weakness and headaches.   all other systems are negative except as noted in the HPI and PMH.  Physical Exam Updated Vital Signs BP 122/89 (BP Location: Right Arm)   Pulse 90   Temp 98.8 F (37.1 C)   Resp 18   SpO2 98%  Physical Exam Vitals and nursing note reviewed.  Constitutional:      General: She is not in acute distress.    Appearance: She is well-developed.  HENT:     Head: Normocephalic and atraumatic.     Mouth/Throat:     Pharynx: No oropharyngeal exudate.  Eyes:     Conjunctiva/sclera: Conjunctivae normal.     Pupils: Pupils are equal, round, and reactive to light.  Neck:     Comments: No meningismus. Cardiovascular:     Rate and Rhythm:  Normal rate and regular rhythm.     Heart sounds: Normal heart sounds. No murmur heard. Pulmonary:     Effort: Pulmonary effort is normal. No respiratory distress.     Breath sounds: Normal breath sounds.  Abdominal:     Palpations: Abdomen is soft.     Tenderness: There is no abdominal tenderness. There is no guarding or rebound.  Musculoskeletal:        General: No tenderness. Normal range of motion.     Cervical back: Normal range of motion and neck supple.     Comments: 5/5 strength in bilateral lower extremities. Ankle plantar and dorsiflexion intact. Great toe extension intact bilaterally. +2 DP and PT pulses. +2 patellar reflexes bilaterally. Normal gait.   Skin:    General: Skin is warm.  Neurological:     Mental Status: She is alert and oriented to person, place, and time.     Cranial Nerves: No cranial nerve deficit.     Motor: No abnormal muscle tone.     Coordination: Coordination normal.     Comments:  5/5 strength throughout. CN 2-12 intact.Equal grip strength.   Psychiatric:        Behavior: Behavior normal.     ED Results / Procedures / Treatments   Labs (all labs ordered are listed, but only abnormal results are displayed) Labs Reviewed  URINALYSIS, ROUTINE W REFLEX MICROSCOPIC - Abnormal; Notable for the following components:      Result Value   APPearance HAZY (*)    Hgb urine dipstick SMALL (*)    Leukocytes,Ua LARGE (*)    Bacteria, UA RARE (*)    All other components within normal limits  URINE CULTURE  CBC WITH DIFFERENTIAL/PLATELET  BASIC METABOLIC PANEL  CK  I-STAT BETA HCG BLOOD, ED ([facility], [facility], [facility] ONLY)  TROPONIN I (HIGH SENSITIVITY)  TROPONIN I (HIGH SENSITIVITY)    EKG EKG Interpretation  Date/Time:  Saturday Feb 20 2023 17:18:31 EDT Ventricular Rate:  77 PR Interval:  166 QRS Duration: 79 QT Interval:  365 QTC Calculation: 413 R Axis:   55 Text Interpretation: Sinus rhythm Abnormal R-wave progression, early transition No significant  change was found Confirmed by Glynn Octave [phone]) on 02/20/2023 5:19:39 PM  Radiology MR LUMBAR SPINE WO CONTRAST  Result Date: 02/20/2023 CLINICAL DATA:  Myelopathy, acute, lumbar spine.  Left leg pain. EXAM: MRI LUMBAR SPINE WITHOUT CONTRAST TECHNIQUE: Multiplanar, multisequence MR imaging of the lumbar spine was performed. No intravenous contrast was administered. COMPARISON:  Lumbar spine radiographs 05/03/2014 FINDINGS: Segmentation:  Standard. Alignment:  Normal. Vertebrae: No fracture, suspicious marrow lesion, or significant marrow edema. Small L3 superior endplate Schmorl's node. Conus medullaris and cauda equina: Conus extends to the L1 level. Conus and cauda equina appear normal. Paraspinal and other soft tissues:  Unremarkable. Disc levels: T12-L1 and L1-2: Negative. L2-3: Disc desiccation and mild disc space narrowing. Mild disc bulging without stenosis. L3-4: Mild disc bulging without stenosis. L4-5: Disc bulging and mild facet and ligamentum flavum hypertrophy result in mild left greater than right lateral recess stenosis and mild bilateral neural foraminal stenosis. L5-S1: Negative. IMPRESSION: Mild lumbar spondylosis, greatest at L4-5 where there is mild lateral recess and neural foraminal stenosis. Electronically Signed   By: Sebastian Ache M.D.   On: 02/20/2023 18:33    Procedures Procedures    Medications Ordered in ED Medications  dexamethasone (DECADRON) injection 10 mg (has no administration in time range)    ED Course/ Medical Decision Making/ A&P                             Medical Decision Making Amount and/or Complexity of Data Reviewed Independent Historian: spouse Labs: ordered. Decision-making details documented in ED Course. Radiology: ordered and independent interpretation performed. Decision-making details documented in ED Course. ECG/medicine tests: ordered and independent interpretation performed. Decision-making details documented in ED  Course.  Risk Prescription drug management.   Atraumatic right hip and leg pain.  No weakness, numbness or tingling.  Neurovascularly intact.  Equal distal strength, sensation, pulses and reflexes.  No abdominal pain or back pain.  Low suspicion for cord compression or cauda equina.  Do consider lumbar radiculopathy or a lateral femoral continuous nerve entrapment.  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  Patient able to ambulate with slight limp.  Labs are reassuring.  Electrolytes and CK are normal.  Urinalysis with pyuria but negative nitrite negative, positive leukocyte esterase.   MRI is obtained.  Shows no evidence of cord compression or cauda equina.  Slight disc bulging at L4/L5  Treat supportively for suspected sciatica versus lateral primary cutaneous nerve entrapment.  She is on steroids chronically.  Will give muscle relaxers.  Follow-up with PCP.  Return to the ED with worsening pain, weakness, numbness, tingling, bowel or bladder incontinence or any other concerns.         Final Clinical Impression(s) / ED Diagnoses Final diagnoses:  Sciatica of right side    Rx / DC Orders ED Discharge Orders     None         Jawann Urbani, Jeannett Senior, MD 02/20/23 2046

## 2023-02-20 NOTE — ED Notes (Signed)
Patient transported to MRI 

## 2023-02-20 NOTE — ED Notes (Signed)
Patient able to ambulate to restroom.  Steady gait noted.  Slight limp noted due to pain

## 2023-02-21 LAB — URINE CULTURE: Culture: NO GROWTH

## 2024-04-03 ENCOUNTER — Ambulatory Visit (INDEPENDENT_AMBULATORY_CARE_PROVIDER_SITE_OTHER): Payer: Self-pay | Admitting: Internal Medicine

## 2024-04-03 ENCOUNTER — Encounter: Payer: Self-pay | Admitting: Internal Medicine

## 2024-04-03 VITALS — BP 102/70 | HR 76 | Temp 98.3°F | Ht 69.0 in | Wt 194.0 lb

## 2024-04-03 DIAGNOSIS — E2749 Other adrenocortical insufficiency: Secondary | ICD-10-CM | POA: Insufficient documentation

## 2024-04-03 DIAGNOSIS — R42 Dizziness and giddiness: Secondary | ICD-10-CM

## 2024-04-03 DIAGNOSIS — E559 Vitamin D deficiency, unspecified: Secondary | ICD-10-CM | POA: Insufficient documentation

## 2024-04-03 LAB — COMPREHENSIVE METABOLIC PANEL WITH GFR
ALT: 19 U/L (ref 0–35)
AST: 16 U/L (ref 0–37)
Albumin: 4.2 g/dL (ref 3.5–5.2)
Alkaline Phosphatase: 73 U/L (ref 39–117)
BUN: 11 mg/dL (ref 6–23)
CO2: 27 meq/L (ref 19–32)
Calcium: 8.8 mg/dL (ref 8.4–10.5)
Chloride: 104 meq/L (ref 96–112)
Creatinine, Ser: 0.49 mg/dL (ref 0.40–1.20)
GFR: 109.82 mL/min (ref >60.00)
Glucose, Bld: 94 mg/dL (ref 70–99)
Potassium: 3.8 meq/L (ref 3.5–5.1)
Sodium: 139 meq/L (ref 135–145)
Total Bilirubin: 0.4 mg/dL (ref 0.2–1.2)
Total Protein: 7.5 g/dL (ref 6.0–8.3)

## 2024-04-03 LAB — CBC WITH DIFFERENTIAL/PLATELET
Basophils Absolute: 0 K/uL (ref 0.0–0.1)
Basophils Relative: 0.2 % (ref 0.0–3.0)
Eosinophils Absolute: 0.1 K/uL (ref 0.0–0.7)
Eosinophils Relative: 2 % (ref 0.0–5.0)
HCT: 38 % (ref 36.0–46.0)
Hemoglobin: 12.5 g/dL (ref 12.0–15.0)
Lymphocytes Relative: 53.8 % — ABNORMAL HIGH (ref 12.0–46.0)
Lymphs Abs: 1.9 K/uL (ref 0.7–4.0)
MCHC: 33 g/dL (ref 30.0–36.0)
MCV: 81.8 fl (ref 78.0–100.0)
Monocytes Absolute: 0.4 K/uL (ref 0.1–1.0)
Monocytes Relative: 10.1 % (ref 3.0–12.0)
Neutro Abs: 1.2 K/uL — ABNORMAL LOW (ref 1.4–7.7)
Neutrophils Relative %: 33.9 % — ABNORMAL LOW (ref 43.0–77.0)
Platelets: 177 K/uL (ref 150.0–400.0)
RBC: 4.65 Mil/uL (ref 3.87–5.11)
RDW: 13.7 % (ref 11.5–15.5)
WBC: 3.5 K/uL — ABNORMAL LOW (ref 4.0–10.5)

## 2024-04-03 LAB — TSH: TSH: 4 u[IU]/mL (ref 0.35–5.50)

## 2024-04-03 LAB — IBC + FERRITIN
Ferritin: 16.3 ng/mL (ref 10.0–291.0)
Iron: 57 ug/dL (ref 42–145)
Saturation Ratios: 13.6 % — ABNORMAL LOW (ref 20.0–50.0)
TIBC: 420 ug/dL (ref 250.0–450.0)
Transferrin: 300 mg/dL (ref 212.0–360.0)

## 2024-04-03 LAB — VITAMIN D 25 HYDROXY (VIT D DEFICIENCY, FRACTURES): VITD: 17.67 ng/mL — ABNORMAL LOW (ref 30.00–100.00)

## 2024-04-03 LAB — CORTISOL: Cortisol, Plasma: 6.4 ug/dL

## 2024-04-03 LAB — B12 AND FOLATE PANEL
Folate: >23.2 ng/mL (ref >5.9)
Vitamin B-12: 336 pg/mL (ref 211–911)

## 2024-04-03 NOTE — Progress Notes (Signed)
 [facility] [address] [place] [address] Dept: [phone] Dept Fax: [phone]  New Patient Office Visit  Subjective:   Lindsey Molina 06-08-1976 04/03/2024  Chief Complaint  Patient presents with   Establish Care   Dizziness    Started 2 weeks ago    Due to language barrier, a medical interpreter was present during the HPI, ROS, and discussion for the plan of care.  Interpreter: Elzehour   HPI: Lindsey Molina presents today to establish care at [facility] [employer]. Introduced to Publishing rights manager role and practice setting.  All questions answered.  Concerns: See below   Discussed the use of AI scribe software for clinical note transcription with the patient, who gave verbal consent to proceed.  History of Present Illness   Lindsey Molina is a 48 year old female with adrenal insufficiency who presents with dizziness.  She has been experiencing dizziness for the past two weeks. Initially, the dizziness was accompanied by muscle pain, but recently it has occurred without muscular pain. The dizziness is severe enough to cause a sensation of potential falling, though she has not actually fallen. No chest pain or palpitations, but she notes occasional shortness of breath.  She has a history of adrenal insufficiency and manages it with hydrocortisone  tablets. Her dosing is variable, taking two tablets when feeling tired, one when dizzy, and adjusting based on her symptoms, sometimes taking two tablets daily if her head does not feel okay. She has not seen her endocrinologist in the past year.  She also has a history of anemia, possibly related to pregnancy, but is not currently taking iron supplements.   She takes an over-the-counter vitamin D  supplement daily for previous vitamin D  deficiency, though she is unsure of the dosage.  She experiences constipation, sometimes going two to three days  without a bowel movement, and feels that this may be related to her dizziness. Denies any blood or black stools.        The following portions of the patient's history were reviewed and updated as appropriate: past medical history, past surgical history, family history, social history, allergies, medications, and problem list.   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 is unsure if she has this   Hypotension 05/25/2012   Ileus, postoperative (HCC) 02/21/2016   Numbness and tingling 05/25/2012   and pain right arm when I get mad,  nervous and/or blackout; sometimes; not always   Ovarian cyst in pregnancy 07/26/2015   Large right ovarian cyst 07/04/15     Respiratory arrest (HCC) 05/25/2012   occurs when I black out; I can't breath   Shortness of breath 05/25/2012   occurs when I pass out   Status post repeat low transverse cesarean section 02/18/2016   Supervision of high risk pregnancy, antepartum 07/26/2015            Clinic  High Risk    Prenatal Labs      Dating     by U/S 07/04/15 [redacted]w[redacted]d    Blood type: O/POS/-- (11/17 1527)       Genetic Screen    1 Screen:  NT nml    AFP:  NIPS:  wnl    Antibody:NEG (11/17 1527)      Anatomic US      wnl    Rubella: 10.20 (11/17 1527)      GTT    Early:  169 Third trimester: wnl (75/150/131/108)    RPR: NON REAC (11/17 1527)       Flu vaccine    declined    HBsAg   Syncope  and collapse 2011; 05/24/2012   Past Surgical History:  Procedure Laterality Date   CESAREAN SECTION  2005; 2011   CESAREAN SECTION N/A 02/18/2016   Procedure: REPEAT CESAREAN SECTION;  Surgeon: Glenys GORMAN Birk, MD;  Location: [facility] BIRTHING SUITES;  Service: Obstetrics;  Laterality: N/A;   Family History  Problem Relation Age of Onset   Alcohol abuse Neg Hx    Arthritis Neg Hx    Asthma Neg Hx    Birth defects Neg Hx    Cancer Neg Hx    COPD Neg Hx    Depression Neg Hx    Diabetes Neg Hx    Drug abuse Neg Hx    Early death Neg Hx    Hearing loss Neg Hx    Heart disease Neg Hx    Hyperlipidemia Neg Hx    Hypertension Neg Hx    Kidney disease Neg Hx    Learning disabilities Neg Hx    Mental illness Neg Hx    Mental retardation Neg Hx    Miscarriages / Stillbirths Neg Hx    Stroke Neg Hx    Vision loss Neg Hx    Varicose Veins Neg Hx     Current Outpatient Medications:    Cholecalciferol (VITAMIN D -3 PO), Take by mouth., Disp: , Rfl:    hydrocortisone  (CORTEF ) 10 MG tablet, Take 10 mg by mouth daily., Disp: , Rfl:    naproxen  (NAPROSYN ) 500 MG tablet, Take 1 tablet (500 mg total) by mouth 2 (two) times daily., Disp: 14 tablet, Rfl: 0   NON FORMULARY, Take 1 tablet by mouth 2 (two) times daily. Medicine she takes for blood, Disp: , Rfl:  No Known Allergies  ROS: A complete ROS was performed with pertinent positives/negatives noted in the HPI. The remainder of the ROS are negative.   Objective:   Today's Vitals   04/03/24 0914  BP: 102/70  Pulse: 76  Temp: 98.3 F (36.8 C)  TempSrc: Temporal  SpO2: 99%  Weight: 194 lb (88 kg)  Height: 5' 9 (1.753 m)    GENERAL: Well-appearing, in NAD. Well nourished.  SKIN: Pink, warm and dry. No rash, lesion, ulceration, or ecchymoses.  NECK: Trachea midline. Full ROM w/o pain or tenderness. No lymphadenopathy. No thyromegaly or palpable masses.  RESPIRATORY: Chest wall symmetrical. Respirations even and non-labored. Breath sounds  clear to auscultation bilaterally.  CARDIAC: S1, S2 present, regular rate and rhythm. Peripheral pulses 2+ bilaterally.  MSK: Muscle tone and strength appropriate for age. Joints w/o tenderness, redness, or swelling.  EXTREMITIES: Without clubbing, cyanosis, or edema.  NEUROLOGIC: Steady, even gait.  PSYCH/MENTAL STATUS: Alert, oriented x 3. Cooperative, appropriate mood and affect.   Health Maintenance Due  Topic Date Due   Colonoscopy  Never done   MAMMOGRAM  Never done    No results found for any visits on 04/03/24.  Assessment & Plan:  Assessment and Plan    Dizziness Dizziness for two weeks, severe, with near falls. Potential causes include anemia, vitamin D  deficiency, thyroid dysfunction, and adrenal insufficiency. - Order CBC, CMP, iron and ferritin, B12 and folate, thyroid function tests, and vitamin D  level.  Secondary adrenal insufficiency Managed with hydrocortisone  as needed. No endocrinologist visit in a year.  - Check cortisol level. - Advise follow-up with endocrinologist for annual evaluation.   Orders Placed This Encounter  Procedures   CBC with Differential/Platelet   Comp Met (CMET)   IBC + Ferritin   VITAMIN D  25 Hydroxy (Vit-D Deficiency, Fractures)   B12 and Folate Panel   TSH   Cortisol   No orders of the defined types were placed in this encounter.   Return for Annual Physical Exam with fasting lab work.   Rosina Senters, FNP

## 2024-04-06 ENCOUNTER — Encounter: Payer: Self-pay | Admitting: Internal Medicine

## 2024-04-06 ENCOUNTER — Ambulatory Visit: Payer: Self-pay | Admitting: Internal Medicine

## 2024-04-06 DIAGNOSIS — E559 Vitamin D deficiency, unspecified: Secondary | ICD-10-CM

## 2024-04-07 ENCOUNTER — Encounter: Payer: Self-pay | Admitting: Internal Medicine

## 2024-04-07 MED ORDER — VITAMIN D (ERGOCALCIFEROL) 1.25 MG (50000 UNIT) PO CAPS: 50000.0000 [IU] | ORAL_CAPSULE | ORAL | 0 refills | Status: DC

## 2024-04-14 ENCOUNTER — Encounter: Admitting: Internal Medicine

## 2024-05-02 ENCOUNTER — Encounter: Payer: Self-pay | Admitting: Internal Medicine

## 2024-05-02 ENCOUNTER — Ambulatory Visit (INDEPENDENT_AMBULATORY_CARE_PROVIDER_SITE_OTHER): Admitting: Internal Medicine

## 2024-05-02 VITALS — BP 112/70 | HR 82 | Temp 98.2°F | Ht 69.0 in | Wt 194.2 lb

## 2024-05-02 DIAGNOSIS — Z0000 Encounter for general adult medical examination without abnormal findings: Secondary | ICD-10-CM | POA: Diagnosis not present

## 2024-05-02 DIAGNOSIS — Z1211 Encounter for screening for malignant neoplasm of colon: Secondary | ICD-10-CM

## 2024-05-02 DIAGNOSIS — Z1231 Encounter for screening mammogram for malignant neoplasm of breast: Secondary | ICD-10-CM

## 2024-05-02 LAB — LIPID PANEL
Cholesterol: 177 mg/dL (ref 0–200)
HDL: 49.9 mg/dL (ref >39.00)
LDL Cholesterol: 103 mg/dL — ABNORMAL HIGH (ref 0–99)
NonHDL: 127.21
Total CHOL/HDL Ratio: 4
Triglycerides: 119 mg/dL (ref 0.0–149.0)
VLDL: 23.8 mg/dL (ref 0.0–40.0)

## 2024-05-02 NOTE — Progress Notes (Signed)
 Subjective:   Lindsey Molina [birth year]  05/02/2024   CC: Chief Complaint  Patient presents with   Annual Exam    Fasting    Due to language barrier, a medical interpreter was present during the HPI, ROS, and discussion for the plan of care.  Interpreter: Ambrosio #[id number]   HPI: Lindsey Molina is a 48 y.o. female who presents for a routine health maintenance exam.  Labs collected at time of visit.    HEALTH SCREENINGS: - Pap smear: up to date - Mammogram (40+): Ordered today  - Colonoscopy (45+): Ordered today  - Bone Density (65+): Not applicable  - Lung CA screening with low-dose CT:  Not applicable Adults age 24-80 who are current cigarette smokers or quit within the last 15 years. Must have 20 pack year history.   Depression and Anxiety Screen done today and results listed below:     05/02/2024    2:31 PM 04/03/2024    9:27 AM 08/24/2016   11:30 AM 04/23/2016    8:27 AM 11/25/2015    8:59 AM  Depression screen PHQ 2/9  Decreased Interest 0 0 0 0 0  Down, Depressed, Hopeless 0 0 0 0 0  PHQ - 2 Score 0 0 0 0 0  Altered sleeping 0 0     Tired, decreased energy 0 3  0   Change in appetite 0 1  1   Feeling bad or failure about yourself  0 0  0   Trouble concentrating 0 0  0   Moving slowly or fidgety/restless 0 1  0   Suicidal thoughts 0 0  0    PHQ-9 Score 0 5     Difficult doing work/chores Not difficult at all Somewhat difficult  Not difficult at all      Data saved with a previous flowsheet row definition      05/02/2024    2:31 PM 04/03/2024    9:27 AM 04/23/2016    8:00 AM  GAD 7 : Generalized Anxiety Score  Nervous, Anxious, on Edge 0 0 0  Control/stop worrying 0 0 0  Worry too much - different things 0 0 1  Trouble relaxing 0 0 0  Restless 0 0 0  Easily annoyed or irritable 0 0 0  Afraid - awful might happen 0 0 0  Total GAD 7 Score 0 0 1  Anxiety Difficulty Not difficult at all Not difficult at all Not difficult at all    IMMUNIZATIONS: - Tdap:  Tetanus vaccination status reviewed: last tetanus booster within 10 years. - HPV: Not applicable - Influenza: Postponed to flu season - Prevnar 20: Refused   Past medical history, surgical history, medications, allergies, family history and social history reviewed with patient today and changes made to appropriate areas of the chart.   Past Medical History:  Diagnosis Date   Anemia of mother in pregnancy, antepartum 07/26/2015   Blood transfusion 07/04/15 at [facility] Emergency Dept.     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 this   Hypotension 05/25/2012   Ileus, postoperative (HCC) 02/21/2016   Numbness and tingling 05/25/2012   and pain right arm when I get mad,  nervous and/or blackout; sometimes; not always   Ovarian cyst in pregnancy 07/26/2015   Large right  ovarian cyst 07/04/15     Respiratory arrest (HCC) 05/25/2012   occurs when I black out; I can't breath   Shortness of breath 05/25/2012   occurs when I pass out   Status post repeat low transverse cesarean section 02/18/2016   Supervision of high risk pregnancy, antepartum 07/26/2015            Clinic      High Risk    Prenatal Labs      Dating     by U/S 07/04/15 [redacted]w[redacted]d    Blood type: O/POS/-- (11/17 1527)       Genetic Screen    1 Screen:  NT nml    AFP:  NIPS:  wnl    Antibody:NEG (11/17 1527)      Anatomic US      wnl    Rubella: 10.20 (11/17 1527)      GTT    Early:  169 Third trimester: wnl (75/150/131/108)    RPR: NON REAC (11/17 1527)       Flu vaccine    declined    HBsAg   Syncope and collapse 2011; 05/24/2012    Past Surgical History:  Procedure Laterality Date   CESAREAN SECTION  2005; 2011   CESAREAN SECTION N/A 02/18/2016   Procedure: REPEAT CESAREAN SECTION;  Surgeon: Glenys GORMAN Birk, MD;  Location: [facility] BIRTHING  SUITES;  Service: Obstetrics;  Laterality: N/A;    Current Outpatient Medications on File Prior to Visit  Medication Sig   hydrocortisone  (CORTEF ) 10 MG tablet Take 10 mg by mouth daily.   Vitamin D , Ergocalciferol , (DRISDOL ) 1.25 MG (50000 UNIT) CAPS capsule Take 1 capsule (50,000 Units total) by mouth every 7 (seven) days.   Cholecalciferol (VITAMIN D -3 PO) Take by mouth.   No current facility-administered medications on file prior to visit.    No Known Allergies   Social History   Socioeconomic History   Marital status: Married    Spouse name: Not on file   Number of children: Not on file   Years of education: Not on file   Highest education level: Not on file  Occupational History   Not on file  Tobacco Use   Smoking status: Never   Smokeless tobacco: Never  Vaping Use   Vaping status: Never Used  Substance and Sexual Activity   Alcohol use: No   Drug use: No   Sexual activity: Not Currently    Birth control/protection: None  Other Topics Concern   Not on file  Social History Narrative   Not on file   Social Drivers of Health   Financial Resource Strain: Not on file  Food Insecurity: No Food Insecurity (05/22/2021)   [facility]   Hunger Vital Sign    Within the past 12 months, you worried that your food would run out before you got the money to buy more.: Never true    Within the past 12 months, the food you bought just didn't last and you didn't have money to get more.: Never true  Transportation Needs: Not on file  Physical Activity: Not on file  Stress: Not on file  Social Connections: Unknown (02/06/2022)   [facility]   Social Network    Social Network: Not on file  Intimate Partner Violence: Unknown (12/31/2021)   Received from [facility]   HITS    Physically Hurt: Not on file    Insult or Talk Down To: Not on file    Threaten  Physical Harm: Not on file    Scream or Curse: Not on file   Social History   Tobacco  Use  Smoking Status Never  Smokeless Tobacco Never   Social History   Substance and Sexual Activity  Alcohol Use No    Family History  Problem Relation Age of Onset   Alcohol abuse Neg Hx    Arthritis Neg Hx    Asthma Neg Hx    Birth defects Neg Hx    Cancer Neg Hx    COPD Neg Hx    Depression Neg Hx    Diabetes Neg Hx    Drug abuse Neg Hx    Early death Neg Hx    Hearing loss Neg Hx    Heart disease Neg Hx    Hyperlipidemia Neg Hx    Hypertension Neg Hx    Kidney disease Neg Hx    Learning disabilities Neg Hx    Mental illness Neg Hx    Mental retardation Neg Hx    Miscarriages / Stillbirths Neg Hx    Stroke Neg Hx    Vision loss Neg Hx    Varicose Veins Neg Hx      ROS: Denies fever, fatigue, unexplained weight loss/gain, hearing or vision changes, cardiac or respiratory complaints. Denies neurological deficits, musculoskeletal complaints, gastrointestinal or genitourinary complaints, mental health complaints, and skin changes.   Objective:   Today's Vitals   05/02/24 1429  BP: 112/70  Pulse: 82  Temp: 98.2 F (36.8 C)  TempSrc: Temporal  SpO2: 98%  Weight: 194 lb 3.2 oz (88.1 kg)  Height: 5' 9 (1.753 m)    GENERAL APPEARANCE: Well-appearing, in NAD. Well nourished.  SKIN: Pink, warm and dry. Turgor normal. No rash, lesion, ulceration, or ecchymoses. Hair evenly distributed.  HEENT: HEAD: Normocephalic.  EYES: PERRLA. EOMI. Lids intact w/o defect. Sclera white, Conjunctiva pink w/o exudate.  EARS: External ear w/o redness, swelling, masses or lesions. EAC clear. TM's intact, translucent w/o bulging, appropriate landmarks visualized. Appropriate acuity to conversational tones.  NOSE: Septum midline w/o deformity. Nares patent, mucosa pink and non-inflamed w/o drainage.  THROAT: Uvula midline. Oropharynx clear. Tonsils non-inflamed w/o exudate . Oral mucosa pink and moist.  NECK: Supple, Trachea midline. Full ROM w/o pain or tenderness. No  lymphadenopathy. Thyroid non-tender w/o enlargement or palpable masses.  BREASTS: Breasts pendulous, symmetrical, and w/o palpable masses. Nipples everted and w/o discharge. No rash or skin retraction. No axillary or supraclavicular lymphadenopathy.  RESPIRATORY: Chest wall symmetrical w/o masses. Respirations even and non-labored. Breath sounds clear to auscultation bilaterally. No wheezes, rales, rhonchi, or crackles. CARDIAC: S1, S2 present, regular rate and rhythm. No gallops, murmurs, rubs, or clicks. Capillary refill <2 seconds. Peripheral pulses 2+ bilaterally. GI: Abdomen soft w/o distention. Normoactive bowel sounds. No palpable masses or tenderness. No guarding or rebound tenderness. Liver and spleen w/o tenderness or enlargement. No CVA tenderness.  MSK: Muscle tone and strength appropriate for age, w/o atrophy or abnormal movement.  EXTREMITIES: Active ROM intact, w/o tenderness, crepitus, or contracture. No obvious joint deformities or effusions. No clubbing, edema, or cyanosis.  NEUROLOGIC: CN's II-XII intact. Motor strength symmetrical with no obvious weakness. No sensory deficits. Steady, even gait.  PSYCH/MENTAL STATUS: Alert, oriented x 3. Cooperative, appropriate mood and affect.    Results for orders placed or performed in visit on 04/03/24  CBC with Differential/Platelet   Collection Time: 04/03/24  9:45 AM  Result Value Ref Range   WBC 3.5 (L) 4.0 - 10.5  K/uL   RBC 4.65 3.87 - 5.11 Mil/uL   Hemoglobin 12.5 12.0 - 15.0 g/dL   HCT 61.9 63.9 - 53.9 %   MCV 81.8 78.0 - 100.0 fl   MCHC 33.0 30.0 - 36.0 g/dL   RDW 86.2 88.4 - 84.4 %   Platelets 177.0 150.0 - 400.0 K/uL   Neutrophils Relative % 33.9 (L) 43.0 - 77.0 %   Lymphocytes Relative 53.8 (H) 12.0 - 46.0 %   Monocytes Relative 10.1 3.0 - 12.0 %   Eosinophils Relative 2.0 0.0 - 5.0 %   Basophils Relative 0.2 0.0 - 3.0 %   Neutro Abs 1.2 (L) 1.4 - 7.7 K/uL   Lymphs Abs 1.9 0.7 - 4.0 K/uL   Monocytes Absolute 0.4 0.1 -  1.0 K/uL   Eosinophils Absolute 0.1 0.0 - 0.7 K/uL   Basophils Absolute 0.0 0.0 - 0.1 K/uL  Comp Met (CMET)   Collection Time: 04/03/24  9:45 AM  Result Value Ref Range   Sodium 139 135 - 145 mEq/L   Potassium 3.8 3.5 - 5.1 mEq/L   Chloride 104 96 - 112 mEq/L   CO2 27 19 - 32 mEq/L   Glucose, Bld 94 70 - 99 mg/dL   BUN 11 6 - 23 mg/dL   Creatinine, Ser 9.50 0.40 - 1.20 mg/dL   Total Bilirubin 0.4 0.2 - 1.2 mg/dL   Alkaline Phosphatase 73 39 - 117 U/L   AST 16 0 - 37 U/L   ALT 19 0 - 35 U/L   Total Protein 7.5 6.0 - 8.3 g/dL   Albumin 4.2 3.5 - 5.2 g/dL   GFR 890.17 >39.99 mL/min   Calcium 8.8 8.4 - 10.5 mg/dL  IBC + Ferritin   Collection Time: 04/03/24  9:45 AM  Result Value Ref Range   Iron 57 42 - 145 ug/dL   Transferrin 699.9 787.9 - 360.0 mg/dL   Saturation Ratios 86.3 (L) 20.0 - 50.0 %   Ferritin 16.3 10.0 - 291.0 ng/mL   TIBC 420.0 250.0 - 450.0 mcg/dL  VITAMIN D  25 Hydroxy (Vit-D Deficiency, Fractures)   Collection Time: 04/03/24  9:45 AM  Result Value Ref Range   VITD 17.67 (L) 30.00 - 100.00 ng/mL  B12 and Folate Panel   Collection Time: 04/03/24  9:45 AM  Result Value Ref Range   Vitamin B-12 336 211 - 911 pg/mL   Folate >23.2 >5.9 ng/mL  TSH   Collection Time: 04/03/24  9:45 AM  Result Value Ref Range   TSH 4.00 0.35 - 5.50 uIU/mL  Cortisol   Collection Time: 04/03/24  9:45 AM  Result Value Ref Range   Cortisol, Plasma 6.4 ug/dL    Assessment & Plan:  Encounter for general adult medical examination without abnormal findings -     Lipid panel  Breast cancer screening by mammogram -     3D Screening Mammogram, Left and Right; Future  Colon cancer screening -     Ambulatory referral to Gastroenterology    Orders Placed This Encounter  Procedures   MM 3D SCREENING MAMMOGRAM BILATERAL BREAST    Standing Status:   Future    Expiration Date:   05/02/2025    Reason for Exam (SYMPTOM  OR DIAGNOSIS REQUIRED):   screening for breast cancer    Preferred  imaging [facility]?:   [facility]    Is the patient pregnant?:   No   Lipid panel   Ambulatory referral to Gastroenterology    Referral Priority:  Routine    Referral Type:   Consultation    Referral Reason:   Specialty Services Required    Number of Visits Requested:   1    PATIENT COUNSELING:  - Encourage a healthy well-balanced diet. Patient may adjust caloric intake to maintain or achieve ideal body weight. May reduce intake of dietary saturated fat and total fat and have adequate dietary potassium and calcium preferably from fresh fruits, vegetables, and low-fat dairy products.   -  importance of regular exercise  NEXT PREVENTATIVE PHYSICAL DUE IN 1 YEAR.  Return in about 3 months (around 08/02/2024) for Vitamin D  deficiency.  Rosina Senters, FNP

## 2024-05-03 ENCOUNTER — Ambulatory Visit: Payer: Self-pay | Admitting: Internal Medicine

## 2024-05-09 ENCOUNTER — Encounter: Admitting: Internal Medicine

## 2024-06-27 ENCOUNTER — Other Ambulatory Visit: Payer: Self-pay | Admitting: Internal Medicine

## 2024-06-27 DIAGNOSIS — E559 Vitamin D deficiency, unspecified: Secondary | ICD-10-CM

## 2024-06-27 NOTE — Telephone Encounter (Signed)
 Requesting refill for Cholecalciferol (VITAMIN D -3 PO  from historical provider.SABRA  LOV  05/02/24 FOV 08/02/24 LRF  04/03/24

## 2024-07-04 ENCOUNTER — Encounter: Payer: Self-pay | Admitting: Internal Medicine

## 2024-07-31 ENCOUNTER — Encounter: Payer: Self-pay | Admitting: Emergency Medicine

## 2024-07-31 ENCOUNTER — Encounter (HOSPITAL_COMMUNITY): Payer: Self-pay

## 2024-07-31 ENCOUNTER — Emergency Department (HOSPITAL_COMMUNITY)
Admission: EM | Admit: 2024-07-31 | Discharge: 2024-07-31 | Disposition: A | Discharge status: Home / Self Care | Source: Ambulatory Visit | Attending: Emergency Medicine | Admitting: Emergency Medicine

## 2024-07-31 ENCOUNTER — Emergency Department (HOSPITAL_COMMUNITY)

## 2024-07-31 ENCOUNTER — Other Ambulatory Visit: Payer: Self-pay

## 2024-07-31 ENCOUNTER — Ambulatory Visit
Admission: EM | Admit: 2024-07-31 | Discharge: 2024-07-31 | Disposition: A | Discharge status: Home / Self Care | Attending: Nurse Practitioner | Admitting: Nurse Practitioner

## 2024-07-31 DIAGNOSIS — R1031 Right lower quadrant pain: Secondary | ICD-10-CM | POA: Diagnosis not present

## 2024-07-31 DIAGNOSIS — K644 Residual hemorrhoidal skin tags: Secondary | ICD-10-CM

## 2024-07-31 DIAGNOSIS — K625 Hemorrhage of anus and rectum: Secondary | ICD-10-CM

## 2024-07-31 DIAGNOSIS — K6289 Other specified diseases of anus and rectum: Secondary | ICD-10-CM

## 2024-07-31 LAB — URINALYSIS, ROUTINE W REFLEX MICROSCOPIC
Bacteria, UA: NONE SEEN
Bilirubin Urine: NEGATIVE
Glucose, UA: NEGATIVE mg/dL
Hgb urine dipstick: NEGATIVE
Ketones, ur: NEGATIVE mg/dL
Nitrite: NEGATIVE
Protein, ur: NEGATIVE mg/dL
Specific Gravity, Urine: 1.013 (ref 1.005–1.030)
pH: 5 (ref 5.0–8.0)

## 2024-07-31 LAB — COMPREHENSIVE METABOLIC PANEL WITH GFR
ALT: 35 U/L (ref 0–44)
AST: 24 U/L (ref 15–41)
Albumin: 4.6 g/dL (ref 3.5–5.0)
Alkaline Phosphatase: 106 U/L (ref 38–126)
Anion gap: 10 (ref 5–15)
BUN: 12 mg/dL (ref 6–20)
CO2: 24 mmol/L (ref 22–32)
Calcium: 9 mg/dL (ref 8.9–10.3)
Chloride: 104 mmol/L (ref 98–111)
Creatinine, Ser: 0.55 mg/dL (ref 0.44–1.00)
GFR, Estimated: >60 mL/min (ref >60)
Glucose, Bld: 95 mg/dL (ref 70–99)
Potassium: 3.8 mmol/L (ref 3.5–5.1)
Sodium: 138 mmol/L (ref 135–145)
Total Bilirubin: 0.4 mg/dL (ref 0.0–1.2)
Total Protein: 8.3 g/dL — ABNORMAL HIGH (ref 6.5–8.1)

## 2024-07-31 LAB — CBC
HCT: 38.9 % (ref 36.0–46.0)
Hemoglobin: 12.7 g/dL (ref 12.0–15.0)
MCH: 27.2 pg (ref 26.0–34.0)
MCHC: 32.6 g/dL (ref 30.0–36.0)
MCV: 83.3 fL (ref 80.0–100.0)
Platelets: 214 K/uL (ref 150–400)
RBC: 4.67 MIL/uL (ref 3.87–5.11)
RDW: 13.2 % (ref 11.5–15.5)
WBC: 4.6 K/uL (ref 4.0–10.5)
nRBC: 0 % (ref 0.0–0.2)

## 2024-07-31 LAB — HCG, SERUM, QUALITATIVE: Preg, Serum: NEGATIVE

## 2024-07-31 LAB — POC OCCULT BLOOD, ED: Fecal Occult Bld: POSITIVE — AB

## 2024-07-31 LAB — LIPASE, BLOOD: Lipase: 27 U/L (ref 11–51)

## 2024-07-31 MED ORDER — IOHEXOL 300 MG/ML  SOLN
100.0000 mL | Freq: Once | INTRAMUSCULAR | Status: AC | PRN
  Administered 2024-07-31: 100 mL via INTRAVENOUS

## 2024-07-31 MED ORDER — KETOROLAC TROMETHAMINE 60 MG/2ML IM SOLN
60.0000 mg | Freq: Once | INTRAMUSCULAR | Status: AC
  Administered 2024-07-31: 60 mg via INTRAMUSCULAR

## 2024-07-31 NOTE — ED Provider Notes (Signed)
 [place] EMERGENCY DEPARTMENT AT [facility] Provider Note   CSN: [id number] Arrival date & time: 07/31/24  1147     Patient presents with: Abdominal Pain, Rectal Pain, Rectal Bleeding, and Hemorrhoids   Lindsey Molina is a 48 y.o. female with a history of hemorrhoids, presents to the ED with rectal bleeding that began 2 weeks ago.  Patient states that the rectal bleeding happened with no notable preceding event such as trauma or illness. The patient describes the symptoms as blood that fills the entire toilet bowl most every time she uses the restroom.  She states that last week she had nausea, vomiting, and diarrhea without fever. Today, she endorses generalized abdominal pain. Patient denies any blood in emesis, urinary symptoms, weakness, shortness of breath, or chest pain. No recent travel. No sick contacts.    Abdominal Pain Associated symptoms: hematochezia   Rectal Bleeding Associated symptoms: abdominal pain        Prior to Admission medications   Medication Sig Start Date End Date Taking? Authorizing Provider  Cholecalciferol (VITAMIN D -3 PO) Take by mouth.    [provider]  hydrocortisone  (CORTEF ) 10 MG tablet Take 10 mg by mouth daily.    [provider]  Vitamin D , Ergocalciferol , (DRISDOL ) 1.25 MG (50000 UNIT) CAPS capsule TAKE 1 CAPSULE BY MOUTH EVERY 7 DAYS 06/27/24   Billy Knee, FNP    Allergies: Patient has no known allergies.    Review of Systems  Gastrointestinal:  Positive for abdominal pain and hematochezia.    Updated Vital Signs BP 108/66 (BP Location: Right Arm)   Pulse 68   Temp 97.9 F (36.6 C) (Oral)   Resp 18   SpO2 99%   Physical Exam Vitals and nursing note reviewed.  Constitutional:      General: She is not in acute distress.    Appearance: Normal appearance.  HENT:     Head: Normocephalic and atraumatic.  Eyes:     Extraocular Movements: Extraocular movements intact.     Conjunctiva/sclera:  Conjunctivae normal.     Pupils: Pupils are equal, round, and reactive to light.  Cardiovascular:     Rate and Rhythm: Normal rate and regular rhythm.     Pulses: Normal pulses.  Pulmonary:     Effort: Pulmonary effort is normal. No respiratory distress.  Abdominal:     General: Abdomen is flat.     Palpations: Abdomen is soft.     Tenderness: There is generalized abdominal tenderness.     Comments: Patient complained of generalized abdominal tenderness on exam.  No CVA tenderness.  No guarding, no rebound, no distention.  Genitourinary:    Rectum: Guaiac result positive. Tenderness and external hemorrhoid present.     Comments: External hemorrhoids visualized on exam.  Hemoccult positive. Musculoskeletal:        General: Normal range of motion.     Cervical back: Normal range of motion.  Skin:    General: Skin is warm and dry.     Capillary Refill: Capillary refill takes less than 2 seconds.  Neurological:     General: No focal deficit present.     Mental Status: She is alert. Mental status is at baseline.  Psychiatric:        Mood and Affect: Mood normal.     (all labs ordered are listed, but only abnormal results are displayed) Labs Reviewed  COMPREHENSIVE METABOLIC PANEL WITH GFR - Abnormal; Notable for the following components:      Result Value  Total Protein 8.3 (*)    All other components within normal limits  URINALYSIS, ROUTINE W REFLEX MICROSCOPIC - Abnormal; Notable for the following components:   Leukocytes,Ua TRACE (*)    All other components within normal limits  POC OCCULT BLOOD, ED - Abnormal; Notable for the following components:   Fecal Occult Bld POSITIVE (*)    All other components within normal limits  LIPASE, BLOOD  CBC  HCG, SERUM, QUALITATIVE    EKG: None  Radiology: CT ABDOMEN PELVIS W CONTRAST Result Date: 07/31/2024 CLINICAL DATA:  Right lower abdominal and rectal pain for 2 weeks, hemorrhoids EXAM: CT ABDOMEN AND PELVIS WITH CONTRAST  TECHNIQUE: Multidetector CT imaging of the abdomen and pelvis was performed using the standard protocol following bolus administration of intravenous contrast. RADIATION DOSE REDUCTION: This exam was performed according to the departmental dose-optimization program which includes automated exposure control, adjustment of the mA and/or kV according to patient size and/or use of iterative reconstruction technique. CONTRAST:  OMNIPAQUE  IOHEXOL  300 MG/ML  SOLN COMPARISON:  05/22/2011 FINDINGS: Lower chest: No acute pleural or parenchymal lung disease. Hepatobiliary: Hepatic steatosis. No focal liver abnormality. The gallbladder is unremarkable. Pancreas: Unremarkable. No pancreatic ductal dilatation or surrounding inflammatory changes. Spleen: Normal in size without focal abnormality. Adrenals/Urinary Tract: The kidneys enhance normally. No urinary tract calculi or obstruction. The adrenals and bladder are unremarkable. Stomach/Bowel: No bowel obstruction or ileus. Normal appendix right lower quadrant. No bowel wall thickening or inflammatory change. Vascular/Lymphatic: No significant vascular findings are present. No enlarged abdominal or pelvic lymph nodes. Reproductive: Uterus and bilateral adnexa are unremarkable. Dominant 2.3 cm right ovarian follicle. Other: No free fluid or free intraperitoneal gas. No abdominal wall hernia. Musculoskeletal: No acute or destructive bony abnormalities. Reconstructed images demonstrate no additional findings. IMPRESSION: 1. No acute intra-abdominal or intrapelvic process. Normal appendix. 2. Hepatic steatosis. Electronically Signed   By: Ozell Daring M.D.   On: 07/31/2024 16:00     Procedures   Medications Ordered in the ED  iohexol  (OMNIPAQUE ) 300 MG/ML solution 100 mL (100 mLs Intravenous Contrast Given 07/31/24 1513)                                    Medical Decision Making Amount and/or Complexity of Data Reviewed Labs: ordered.   Patient presents to  the ED for concern of rectal bleeding x 2 weeks, this involves an extensive number of treatment options, and is a complaint that carries with it a high risk of complications and morbidity.    The differential diagnosis includes: Hemorrhoids/fissure Gastric ulcer Infectious etiology  Co morbidities that complicate the patient evaluation: None  Additional history obtained: Additional history obtained from  Outside Medical Records  External records from outside source obtained and reviewed including medical history, surgical history, medications, allergies The patient and her husband were reliable historians, providing a clear, detailed, and consistent account of the presenting symptoms and relevant medical history. The information was obtained directly from the patient and her husband and statements were documented in the patient's own words when possible. No discrepancies were noted between the history provided and available collateral sources.     Patient is primarily Arabic speaking - patient was offered a nurse, learning disability at this appointment and patient declined, patient stated that she would feel more comfortable with her husband translating for her.  Lab Tests: I ordered, and personally interpreted labs.   The pertinent results  include:  CBC WNL CMP WNL Lipase WNL HcG Negative UA trace leukocytes POC occult blood - positive   Imaging Studies ordered: I ordered imaging studies including: CT abdomen pelvis with contrast I independently visualized and interpreted imaging which showed: No acute intra-abdominal or intrapelvic process.  Normal appendix. Hepatic steatosis. I agree with the radiologist interpretation  Medicines ordered and prescription drug management: No medications ordered during this visit. I have reviewed the patients home medicines and have made adjustments as needed  Test Considered: No additional diagnostic testing was considered based on the patient's presenting  symptoms, risk factors, and initial clinical assessment.  The approach to diagnostic testing prioritized exclusion of life-threatening conditions  Problem List / ED Course: Problem List: Abdominal pain Rectal bleeding Emergency Department Course: The patient presented with rectal bleeding x 2 weeks. Initial assessment included history, physical exam, and review of prior medical records. CMP and CBC were completed to rule out infectious or metabolic causes of rectal bleeding - no acute findings. Lipase negative. UA showed trace leukocytes, given patient does not have any urinary symptoms at this time, most likely a bad catch not warranting any further treatment. Hemoccult was positive and external hemorrhoids noted on exam. Given generalized abdominal pain and rectal bleeding, CT was completed to rule out other potential causes of bleeding - CT showed no acute findings. Given patient history, physical exam findings, laboratory studies, imaging - rectal bleeding is most likely due to hemorrhoids - plan to discharge patient with close follow-up with PCP for further management and care.   Dispostion: After consideration of the diagnostic results and the patients physical exam findings, I feel that the patent would benefit from discharge home with close follow-up with her PCP for further evaluation and care of hemorrhoids. Clinical Assessment:    Working diagnosis: rectal bleeding due to external hemorrhoids Disposition Plan: The patient is medically stable for discharge from the Emergency Department at this time. Vital signs are within normal limits, and the patient is alert, oriented, and in no acute distress. Evaluation has been completed with no findings necessitating hospital admission or further emergent workup.  Communication:   Patient and her husband were informed of disposition decision and rationale. Questions addressed. The clinical impression was discussed with the patient and husband at  bedside and the patient demonstrated understanding.     Final diagnoses:  Rectal bleeding    ED Discharge Orders     None          Willma Duwaine CROME, [place] 07/31/24 2302    Nanavati, Ankit, MD 08/01/24 1805

## 2024-07-31 NOTE — Discharge Instructions (Addendum)
?? ???? ????? ???? ??? ?? ????? ?????? ?????? ???? ?? ????? ?????? ?????? ?? ?????. ???? ????? ???? ??? ??? ????? ?? ??? ????? ?? ????? ?? ??? ???????? ???? ?? ???? ??? ????? ?? ?????? ?? ??? ???????. ???? ??? ?? ???? ??????? ?????? ??????? ??????? ?? ???? ???? ?? ????? ??? ?? ???????? ??? ?? ???? ??????? ??? ??? ??????? ?????? ????? ??????? ??????. ?? ?????? ???? ?? ???? ??????? ???????? ?? ????? ????? ??? ????????. ????? ??? ????? ?????? ?? ?????? ??? ??? ???? ?? ??? ???????? ??? ???? ???? ???? ?????? ?????? ?? ??????? ??????.    You were evaluated today for rectal pain, bleeding, and right lower abdominal pain. Your exam showed tenderness in the right lower part of your abdomen with some signs that could indicate irritation or inflammation in that area. Because appendicitis or another abdominal condition cannot be ruled out here in the clinic, you were advised to go to the emergency department for further evaluation and testing. You were given an injection of Toradol  to help with pain before leaving. Please do not eat or drink anything until you have been seen in the emergency department, as further tests or procedures may be needed.

## 2024-07-31 NOTE — ED Provider Notes (Signed)
 [facility]    CSN: [id number] Arrival date & time: 07/31/24  0912      History   Chief Complaint Chief Complaint  Patient presents with   Hemorrhoids    HPI Lindsey Molina is a 48 y.o. female.   The patient's primary language is Arabic. She speaks some English. Her husband is present and provides translation. Both the patient and her husband declined the use of professional translation services.  The use of AI scribe software for clinical note transcription was discussed with the patient, who provided verbal consent to proceed.  The patient presents with rectal pain and bleeding for the past two weeks, accompanied by right lower abdominal pain. She describes the abdominal pain as constant and difficult to characterize but persistent. She has a history of hemorrhoids, which were surgically removed approximately 5-6 years ago, and reports that she has not experienced similar symptoms since that time. She denies diarrhea, constipation, nausea, vomiting, fever, urinary symptoms, or changes in appetite. She has never undergone a colonoscopy and denies any family history of colon cancer. Past surgical history is significant for three cesarean sections.  The following sections of the patient's history were reviewed and updated as appropriate: allergies, current medications, past family history, past medical history, past social history, past surgical history, and problem list.      Past Medical History:  Diagnosis Date   Anemia of mother in pregnancy, antepartum 07/26/2015   Blood transfusion 07/04/15 at [facility] Emergency [facility].     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   Hypotension 05/25/2012   Ileus, postoperative (HCC) 02/21/2016   Numbness and tingling 05/25/2012   and  pain right arm when I get mad,  nervous and/or blackout; sometimes; not always   Ovarian cyst in pregnancy 07/26/2015   Large right ovarian cyst 07/04/15     Respiratory arrest (HCC) 05/25/2012   occurs when I black out; I can't breath   Shortness of breath 05/25/2012   occurs when I pass out   Status post repeat low transverse cesarean section 02/18/2016   Supervision of high risk pregnancy, antepartum 07/26/2015            Clinic      High Risk    Prenatal Labs      Dating     by U/S 07/04/15 [redacted]w[redacted]d    Blood type: O/POS/-- (11/17 1527)       Genetic Screen    1 Screen:  NT nml    AFP:  NIPS:  wnl    Antibody:NEG (11/17 1527)      Anatomic US      wnl    Rubella: 10.20 (11/17 1527)      GTT    Early:  169 Third trimester: wnl (75/150/131/108)    RPR: NON REAC (11/17 1527)       Flu vaccine    declined    HBsAg   Syncope and collapse 2011; 05/24/2012    Patient Active Problem List   Diagnosis Date Noted   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   Vitamin D  deficiency 04/03/2024   Leukopenia 01/20/2022   Language barrier affecting health care 08/15/2015   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 Authorizing Provider  Cholecalciferol (VITAMIN D -3 PO) Take by mouth.    [provider]  hydrocortisone  (CORTEF ) 10 MG tablet Take 10 mg by mouth daily.    [provider]  Vitamin D , Ergocalciferol , (DRISDOL ) 1.25 MG (50000 UNIT) CAPS capsule  TAKE 1 CAPSULE BY MOUTH EVERY 7 DAYS 06/27/24   Billy Knee, FNP    Family History Family History  Problem Relation Age of Onset   Alcohol abuse Neg Hx    Arthritis Neg Hx    Asthma Neg Hx    Birth defects Neg Hx    Cancer Neg Hx    COPD Neg Hx    Depression Neg Hx    Diabetes Neg Hx    Drug abuse Neg Hx    Early death Neg Hx    Hearing loss Neg Hx    Heart disease Neg Hx    Hyperlipidemia Neg Hx    Hypertension Neg Hx    Kidney disease Neg Hx    Learning disabilities Neg Hx    Mental illness Neg Hx    Mental retardation Neg Hx    Miscarriages / Stillbirths Neg Hx    Stroke Neg Hx    Vision loss Neg Hx    Varicose Veins Neg Hx     Social History Social History   Tobacco Use   Smoking status: Never   Smokeless tobacco: Never  Vaping Use   Vaping status: Never Used  Substance Use Topics   Alcohol use: No   Drug use: No     Allergies   Patient has no known allergies.   Review of Systems Review of Systems  Constitutional:  Positive for fever (subjective fevers last week but none currently).  Gastrointestinal:  Positive for abdominal pain (right lower), anal bleeding (unsure if blood is from rectum or stool), blood in stool (unsure if blood is from rectum or stool) and rectal pain. Negative for abdominal distention, constipation, diarrhea, nausea and vomiting.  Genitourinary:  Negative for dysuria, frequency and urgency.  All other systems reviewed and are negative.    Physical Exam Triage Vital Signs ED Triage Vitals  Encounter Vitals Group     BP 07/31/24 1020 107/77     Girls Systolic BP Percentile --      Girls Diastolic BP Percentile --      Boys Systolic BP Percentile --      Boys Diastolic BP Percentile --      Pulse Rate 07/31/24 1020 68     Resp 07/31/24 1020 16     Temp 07/31/24 1020 98.3 F (36.8 C)     Temp Source 07/31/24 1020 Oral     SpO2 07/31/24 1020 98 %     Weight 07/31/24 1020 194 lb 3.6 oz (  88.1 kg)     Height 07/31/24 1020 5'  9 (1.753 m)     Head Circumference --      Peak Flow --      Pain Score 07/31/24 1036 5     Pain Loc --      Pain Education --      Exclude from Growth Chart --    No data found.  Updated Vital Signs BP 107/77 (BP Location: Right Arm)   Pulse 68   Temp 98.3 F (36.8 C) (Oral)   Resp 16   Ht 5' 9 (1.753 m)   Wt 194 lb 3.6 oz (88.1 kg)   SpO2 98%   BMI 28.68 kg/m   Visual Acuity Right Eye Distance:   Left Eye Distance:   Bilateral Distance:    Right Eye Near:   Left Eye Near:    Bilateral Near:     Physical Exam Vitals reviewed. Exam conducted with a chaperone present Sidra Hy, RN).  Constitutional:      General: She is awake. She is not in acute distress.    Appearance: Normal appearance. She is well-developed. She is not ill-appearing, toxic-appearing or diaphoretic.  HENT:     Head: Normocephalic.     Right Ear: Hearing normal.     Left Ear: Hearing normal.     Nose: Nose normal.     Mouth/Throat:     Mouth: Mucous membranes are moist.  Eyes:     General: Vision grossly intact.     Conjunctiva/sclera: Conjunctivae normal.  Cardiovascular:     Rate and Rhythm: Normal rate and regular rhythm.     Heart sounds: Normal heart sounds.  Pulmonary:     Effort: Pulmonary effort is normal.     Breath sounds: Normal breath sounds and air entry.  Abdominal:     General: Bowel sounds are normal. There is no distension.     Palpations: Abdomen is soft.     Tenderness: There is abdominal tenderness in the right lower quadrant. There is rebound. There is no right CVA tenderness, left CVA tenderness or guarding. Positive signs include psoas sign.     Comments: Positive heel tap  Genitourinary:    Rectum: Tenderness present. No external hemorrhoid or internal hemorrhoid. Normal anal tone.     Comments: Rectal exam reveals perianal tenderness and the presence of perianal skin tags. No active bleeding observed. No internal or external hemorrhoids noted. Rectal tone is  normal.   Musculoskeletal:        General: Normal range of motion.     Cervical back: Normal range of motion and neck supple.  Skin:    General: Skin is warm and dry.  Neurological:     General: No focal deficit present.     Mental Status: She is alert and oriented to person, place, and time.  Psychiatric:        Speech: Speech normal.        Behavior: Behavior is cooperative.      UC Treatments / Results  Labs (all labs ordered are listed, but only abnormal results are displayed) Labs Reviewed - No data to display  EKG   Radiology No results found.  Procedures Procedures (including critical care time)  Medications Ordered in UC Medications  ketorolac  (TORADOL ) injection 60 mg (has no administration in time range)    Initial Impression / Assessment and Plan / UC Course  I have reviewed the triage vital signs and the nursing notes.  Pertinent labs & imaging results that were available during my care of the patient were reviewed by me and considered in my medical decision making (see chart for details).     The patient presents with rectal pain and bleeding for two weeks, associated with right lower quadrant abdominal pain. She has a history of hemorrhoidectomy approximately 5-6 years ago and reports no similar prior episodes. She denies gastrointestinal or urinary symptoms, fever, or changes in appetite. On exam, the patient is afebrile, nontoxic, and hemodynamically stable but appears uncomfortable due to RLQ pain. Abdominal examination reveals rebound tenderness in the right lower quadrant with positive heel tap and psoas signs, raising concern for intra-abdominal pathology such as appendicitis. Rectal exam demonstrates perianal tenderness and skin tags, with no active bleeding or hemorrhoids and normal rectal tone.  Given findings concerning for possible appendicitis, the patient was administered Toradol  60 mg IM in clinic for acute pain relief and advised to proceed to  the emergency department for further evaluation and imaging. She was instructed to remain NPO until assessment is complete. Patient and spouse verbalized understanding and agreement with the plan.  The patient presents with symptoms and exam findings that warrant a higher level of care and further evaluation. Given the clinical picture, emergency department (ED) assessment is recommended for advanced diagnostics and management. The patient is currently stable and appropriate for transport via private vehicle. A responsible adult will be driving. The patient is alert, oriented, demonstrates clear mental status, good insight into their illness, and sound judgment in making decisions regarding their care. The risks, rationale, and need for ED evaluation were discussed, and the patient is agreeable to the plan.  Documentation was completed with the aid of voice recognition software. Transcription may contain typographical errors.  Final Clinical Impressions(s) / UC Diagnoses   Final diagnoses:  RLQ abdominal pain  Rectal pain  Anal skin tag     Discharge Instructions      ?? ???? ????? ???? ??? ?? ????? ?????? ?????? ???? ?? ????? ?????? ?????? ?? ?????. ???? ????? ???? ??? ??? ????? ?? ??? ????? ?? ????? ?? ??? ???????? ???? ?? ???? ??? ????? ?? ?????? ?? ??? ???????. ???? ??? ?? ???? ??????? ?????? ??????? ??????? ?? ???? ???? ?? ????? ??? ?? ???????? ??? ?? ???? ??????? ??? ??? ??????? ?????? ????? ??????? ??????. ?? ?????? ???? ?? ???? ??????? ???????? ?? ????? ????? ??? ????????. ????? ??? ????? ?????? ?? ?????? ??? ??? ???? ?? ??? ???????? ??? ???? ???? ???? ?????? ?????? ?? ??????? ??????.  You were evaluated today for rectal pain, bleeding, and right lower abdominal pain. Your exam showed tenderness in the right lower part of your abdomen with some signs that could indicate irritation or inflammation in that area. Because appendicitis or another abdominal condition cannot be ruled out here in  the clinic, you were advised to go to the emergency department for further evaluation and testing. You were given an injection of Toradol  to help with pain before leaving. Please do not eat or drink anything until you have been seen in the emergency department, as further tests or procedures may be needed.      ED Prescriptions   None    PDMP not reviewed this encounter.   Iola Lukes, [place] 07/31/24 1259

## 2024-07-31 NOTE — ED Triage Notes (Signed)
 Pt arrives via POV after being seen at [facility]. Pt's husband is translating per her request. Pt reports right lower abdominal pain, rectal pain, and rectal bleeding for the past 2 weeks. Pt reports she has hemorrhoids. Pt arrives AxOx4. UC was concerned for possible appendicitis.

## 2024-07-31 NOTE — Discharge Instructions (Addendum)
 Thank you for visiting the Emergency Department today. It was a pleasure to be part of your healthcare team. Your test results showed no acute findings. If you have any questions about your home medicines, please call your pharmacy or healthcare provider. At home, rest, hydrate, resume normal diet. It is important to watch for warning signs such as worsening pain, fever, trouble breathing, chest pain. If any of these happen, return to the Emergency Department or call 911. Thank you for trusting us  with your health.

## 2024-07-31 NOTE — ED Triage Notes (Signed)
 Pt c/o rectal pain and bleeding for two weeks. States she has had hemorrhoids before.

## 2024-07-31 NOTE — ED Notes (Signed)
 Patient is being discharged from the Urgent Care and sent to the Emergency Department via POV . Per Lucie Sarin, NP, patient is in need of higher level of care due to abdominal pain. Patient is aware and verbalizes understanding of plan of care.  Vitals:   07/31/24 1020  BP: 107/77  Pulse: 68  Resp: 16  Temp: 98.3 F (36.8 C)  SpO2: 98%

## 2024-08-02 ENCOUNTER — Ambulatory Visit: Admitting: Internal Medicine

## 2024-08-02 ENCOUNTER — Encounter: Payer: Self-pay | Admitting: Internal Medicine

## 2024-08-02 VITALS — BP 118/80 | HR 76 | Temp 97.7°F | Ht 64.5 in | Wt 195.8 lb

## 2024-08-02 DIAGNOSIS — K644 Residual hemorrhoidal skin tags: Secondary | ICD-10-CM | POA: Diagnosis not present

## 2024-08-02 DIAGNOSIS — E559 Vitamin D deficiency, unspecified: Secondary | ICD-10-CM

## 2024-08-02 DIAGNOSIS — R140 Abdominal distension (gaseous): Secondary | ICD-10-CM | POA: Diagnosis not present

## 2024-08-02 LAB — VITAMIN D 25 HYDROXY (VIT D DEFICIENCY, FRACTURES): VITD: 34.39 ng/mL (ref 30.00–100.00)

## 2024-08-02 MED ORDER — VITAMIN D (ERGOCALCIFEROL) 1.25 MG (50000 UNIT) PO CAPS: 50000.0000 [IU] | ORAL_CAPSULE | ORAL | 0 refills | Status: DC

## 2024-08-02 MED ORDER — HYDROCORTISONE (PERIANAL) 2.5 % EX CREA: 1.0000 | TOPICAL_CREAM | Freq: Two times a day (BID) | CUTANEOUS | 1 refills | Status: AC

## 2024-08-02 NOTE — Patient Instructions (Signed)
 Fiber supplement - Metamucil . Take once daily   Stool softener - Colace. Take 1 capsule 2 times a day for hard stools.

## 2024-08-02 NOTE — Progress Notes (Signed)
 [facility] [address] [place] [address] Dept: [phone] Dept Fax: [phone]    Subjective:   Lindsey Molina Mar 21, 1976 08/02/2024  Chief Complaint  Patient presents with   Follow-up    Vitamin D  refill needed seems to be helping   Due to language barrier, a medical interpreter was present during the HPI, ROS, and discussion for the plan of care.  Interpreter: Asmaa # [id number]   HPI: Lindsey Molina presents today for re-assessment and management of chronic medical conditions.  Discussed the use of AI scribe software for clinical note transcription with the patient, who gave verbal consent to proceed.  History of Present Illness   Lindsey Molina is a 48 year old female who presents for a routine follow-up visit.  She has a history of adrenal insufficiency managed with hydrocortisone  10 mg once daily and vitamin D  deficiency managed with weekly vitamin D  supplementation.  On November 3rd, 2025, she visited the ER due to rectal bleeding. A positive hemoccult test was noted during her ER visit on November 3rd, 2025. CBC, CMP, lipase, urinalysis were WNL. CT abdomen pelvis did not show any acute findings except for some hepatic steatosis. She continues to experience rectal bleeding, primarily during bowel movements, and sometimes even without one when wiping. She also reports rectal pain. Was not given any prescriptions at ER for hemorrhoids.   She experiences chronic abdominal bloating and pain localized to the diffuse right side of her abdomen. The bloating does not seem to be affected by her diet, despite having eliminated spicy and acidic foods, as well as reducing white bread intake for the past two months. No changes in her symptoms have been noted despite these dietary modifications. She describes the pain as being in her intestines rather than her stomach. No pain in the middle or upper abdomen is reported.       The following portions of the patient's history were reviewed and updated as appropriate: past medical history, past surgical history, family history, social history, allergies, medications, and problem list.   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.     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   Hypotension 05/25/2012   Ileus, postoperative (HCC) 02/21/2016   Numbness and tingling 05/25/2012   and pain right arm when I get mad,  nervous and/or blackout; sometimes; not always   Ovarian cyst in pregnancy 07/26/2015   Large right ovarian cyst 07/04/15     Respiratory arrest (HCC) 05/25/2012   occurs when I black out; I can't breath   Shortness of breath 05/25/2012   occurs when I pass out   Status post repeat low transverse cesarean section 02/18/2016   Supervision of high risk pregnancy, antepartum 07/26/2015            Clinic      High Risk    Prenatal Labs      Dating  by U/S 07/04/15 [redacted]w[redacted]d    Blood type: O/POS/-- (11/17 1527)       Genetic Screen    1 Screen:  NT nml    AFP:  NIPS:  wnl    Antibody:NEG (11/17 1527)      Anatomic US      wnl    Rubella: 10.20 (11/17 1527)      GTT    Early:  169 Third trimester: wnl (75/150/131/108)    RPR: NON REAC (11/17 1527)       Flu vaccine    declined    HBsAg   Syncope and collapse 2011; 05/24/2012   Past Surgical History:  Procedure Laterality Date   CESAREAN SECTION  2005; 2011   CESAREAN  SECTION N/A 02/18/2016   Procedure: REPEAT CESAREAN SECTION;  Surgeon: Glenys GORMAN Birk, MD;  Location: [facility];  Service: Obstetrics;  Laterality: N/A;   Family History  Problem Relation Age of Onset   Alcohol abuse Neg Hx    Arthritis Neg Hx    Asthma Neg Hx    Birth defects Neg Hx    Cancer Neg Hx    COPD Neg Hx    Depression Neg Hx    Diabetes Neg Hx    Drug abuse Neg Hx    Early death Neg Hx    Hearing loss Neg Hx    Heart disease Neg Hx    Hyperlipidemia Neg Hx    Hypertension Neg Hx    Kidney disease Neg Hx    Learning disabilities Neg Hx    Mental illness Neg Hx    Mental retardation Neg Hx    Miscarriages / Stillbirths Neg Hx    Stroke Neg Hx    Vision loss Neg Hx    Varicose Veins Neg Hx     Current Outpatient Medications:    Cholecalciferol (VITAMIN D -3 PO), Take by mouth., Disp: , Rfl:    hydrocortisone  (ANUSOL -HC) 2.5 % rectal cream, Place 1 Application rectally 2 (two) times daily., Disp: 30 g, Rfl: 1   hydrocortisone  (CORTEF ) 10 MG tablet, Take 10 mg by mouth daily., Disp: , Rfl:    Vitamin D , Ergocalciferol , (DRISDOL ) 1.25 MG (50000 UNIT) CAPS capsule, Take 1 capsule (50,000 Units total) by mouth every 7 (seven) days., Disp: 12 capsule, Rfl: 0 No Known Allergies   ROS: A complete ROS was performed with pertinent positives/negatives noted in the HPI. The remainder of the ROS are negative.    Objective:   Today's Vitals   08/02/24 1006  BP: 118/80  Pulse: 76  Temp: 97.7 F (36.5 C)  TempSrc: Temporal  SpO2: 98%  Weight: 195 lb 12.8 oz (88.8 kg)  Height: 5' 4.5 (1.638 m)    GENERAL: Well-appearing, in NAD. Well nourished.  SKIN: Pink, warm and dry. No rash, lesion, ulceration, or ecchymoses.  NECK: Trachea midline. Full ROM w/o pain or tenderness. No lymphadenopathy.  RESPIRATORY: Chest wall symmetrical. Respirations even and non-labored. Breath sounds clear to auscultation bilaterally.  CARDIAC: S1, S2 present, regular rate and rhythm.  Peripheral pulses 2+ bilaterally.  GI: Abdomen soft, non-tender. Normoactive bowel sounds. No rebound tenderness. No hepatomegaly or splenomegaly.  RECTAL: external hemorrhoids noted, not thrombosed. Rectal exam chaperoned by Avelina Finder CMA.  EXTREMITIES: Without clubbing, cyanosis, or edema.  NEUROLOGIC: No motor or sensory deficits. Steady, even gait.  PSYCH/MENTAL STATUS: Alert, oriented x 3. Cooperative, appropriate mood and affect.   Health Maintenance Due  Topic Date Due   Mammogram  Never  done   Colonoscopy  Never done   Cervical Cancer Screening (HPV/Pap Cotest)  07/30/2024    No results found for any visits on 08/02/24.  The 10-year ASCVD risk score (Arnett DK, et al., 2019) is: 1%     Assessment & Plan:  Assessment and Plan    External hemorrhoids with rectal bleeding Persistent rectal bleeding and pain due to external hemorrhoids. No infection or internal causes identified. - Prescribed anusol  cream BID for pain and itching - Instructed on sitz baths with warm water for 10-20 minutes, 2-3 times daily. - Recommended over-the-counter stool softener such as Colace. - Advised use of fiber supplement once daily ( Metamucil)  Abdominal bloating - Chronic. Labs and CT abdomen pelvis unremarkable. Persistent despite dietary changes.  - Referred to GI specialist for further evaluation, including potential endoscopy or colonoscopy.  Vitamin D  deficiency Managed with weekly vitamin D  supplementation. - Continue weekly vitamin D  supplementation. - Checked vitamin D  level.     Orders Placed This Encounter  Procedures   VITAMIN D  25 Hydroxy (Vit-D Deficiency, Fractures)   Ambulatory referral to Gastroenterology    Referral Priority:   Routine    Referral Type:   Consultation    Referral Reason:   Specialty Services Required    Number of Visits Requested:   1   No images are attached to the encounter or orders placed in the encounter. Meds ordered this encounter   Medications   Vitamin D , Ergocalciferol , (DRISDOL ) 1.25 MG (50000 UNIT) CAPS capsule    Sig: Take 1 capsule (50,000 Units total) by mouth every 7 (seven) days.    Dispense:  12 capsule    Refill:  0   hydrocortisone  (ANUSOL -HC) 2.5 % rectal cream    Sig: Place 1 Application rectally 2 (two) times daily.    Dispense:  30 g    Refill:  1    Supervising Provider:   SEBASTIAN BEVERLEY NOVAK [[id number]]    Return in about 10 months (around 06/02/2025) for Annual Physical Exam with fasting lab work.   Rosina Senters, FNP

## 2024-08-03 ENCOUNTER — Ambulatory Visit: Payer: Self-pay | Admitting: Internal Medicine

## 2024-08-03 DIAGNOSIS — K644 Residual hemorrhoidal skin tags: Secondary | ICD-10-CM

## 2024-08-18 NOTE — Addendum Note (Signed)
 Addended by: BILLY KNEE on: 08/18/2024 11:57 AM   Modules accepted: Orders

## 2024-08-22 NOTE — Telephone Encounter (Unsigned)
 Copied from CRM #[id number]. Topic: General - Other >> Aug 22, 2024  2:33 PM Thersia BROCKS wrote: Reason for CRM: Patient husband called in regarding general surgery , stated has been waiting for call to be scheduled , would like a call as soon as possible to be scheduled

## 2024-08-22 NOTE — Telephone Encounter (Signed)
 Rosina, please see the message below from the pt. I have checked on the referral and it looks like it is still pending review.

## 2024-08-22 NOTE — Telephone Encounter (Signed)
 She should not have pus coming out of hemorrhoids. This may be a separate issue. She needs to be seen in our office. Please schedule appointment, or she can go to urgent care.

## 2024-09-01 ENCOUNTER — Ambulatory Visit: Payer: Self-pay

## 2024-09-01 ENCOUNTER — Encounter: Payer: Self-pay | Admitting: Physician Assistant

## 2024-09-01 NOTE — Telephone Encounter (Signed)
 FYI Only or Action Required?: Action required by provider: Declines OV for 1x episode of rectal pus. Requesting mammogram.  Patient was last seen in primary care on 08/02/2024 by Billy Knee, FNP.  Called Nurse Triage reporting Rectal Bleeding.  Symptoms began about a month ago.  Interventions attempted: Prescription medications: Anusol , colace, metamucil.  Symptoms are: gradually improving.  Triage Disposition: See PCP Within 2 Weeks  Patient/caregiver understands and will follow disposition?: No, refuses disposition   Copied from CRM [id number]. Topic: Clinical - Red Word Triage >> Sep 01, 2024 10:32 AM Pinkey ORN wrote: Red Word that prompted transfer to Nurse Triage: Bleeding + Pain >> Sep 01, 2024 10:33 AM Pinkey ORN wrote: Patient is experiencing bleeding, pain and pus coming from her hemorrhoids.  Reason for Disposition  Rectal bleeding is a chronic symptom (recurrent or ongoing AND present > 4 weeks)  [1] Rectal bleeding is minimal (e.g., blood just on toilet paper, few drops, streaks on surface of normal formed BM) AND [2] bleeding recurs 3 or more times after using Care Advice  Answer Assessment - Initial Assessment Questions Spoke with pts husband Laouni (on [place]). Pt with ongoing rectal bleeding x1 month. Not on blood thinners, has hx of hemorrhoids. Has been to ED, being followed by PCP. Last OV with PCP on 11/5, rx for anusol , colace and metamucil received. Referrals place to gastroenterology and general surgery to have hemorrhoids removed. Husband has not received a call yet to schedule and calling now to schedule. Reports symptoms have improved since last OV aside from 1x episode of rectal pus. Denies fever. Ongoing intermittent mild abdominal, improved since last OV. Small amount of red blood when wiping. Numbers to [facility] [phone]) and [facility]  [phone]) provided to call and schedule. Attempted to schedule OV with provider for 1x  episode of pus, declines. Advised UC or ED for worsening symptoms. Also asking in mammogram can be ordered, forwarding request to office.    1. APPEARANCE of BLOOD: What color is it? Is it passed separately, on the surface of the stool, or mixed in with the stool?      Red, when wiping  2. AMOUNT: How much blood was passed?      Small amount, when wiping  3. FREQUENCY: How many times has blood been passed with the stools?      Becoming less frequent  4. ONSET: When was the blood first seen in the stools? (Days or weeks)      A month ago  5. DIARRHEA: Is there also some diarrhea? If Yes, ask: How many diarrhea stools in the past 24 hours?      *No Answer*  6. CONSTIPATION: Do you have constipation? If Yes, ask: How bad is it?     *No Answer*  7. RECURRENT SYMPTOMS: Have you had blood in your stools before? If Yes, ask: When was the last time? and What happened that time?      Yes, ongoing for past month. Has been to ED, being followed by PCP  8. BLOOD THINNERS: Do you take any blood thinners? (e.g., aspirin, clopidogrel / Plavix, coumadin, heparin ). Notes: Other strong blood thinners include: Arixtra (fondaparinux), Eliquis (apixaban), Pradaxa (dabigatran), and Xarelto (rivaroxaban).     No  9. OTHER SYMPTOMS: Do you have any other symptoms?  (e.g., abdomen pain, vomiting, dizziness, fever)     Mild intermittent abdominal pain, improved since last visit with PCP on 11/25  Protocols used: Rectal Bleeding-A-AH

## 2024-09-01 NOTE — Telephone Encounter (Signed)
 Noted. Dm/cma

## 2024-09-20 ENCOUNTER — Other Ambulatory Visit: Payer: Self-pay | Admitting: Internal Medicine

## 2024-09-20 DIAGNOSIS — E559 Vitamin D deficiency, unspecified: Secondary | ICD-10-CM

## 2024-09-26 NOTE — Telephone Encounter (Signed)
 Pt is requesting refill forVitamin D, Ergocalciferol , (DRISDOL ) 1.25 MG (50000 UNIT) CAPS capsule   LOV 08/02/24 FOV not scheduled  LRF  08/02/24

## 2024-10-02 ENCOUNTER — Other Ambulatory Visit: Payer: Self-pay | Admitting: Internal Medicine

## 2024-10-02 DIAGNOSIS — E559 Vitamin D deficiency, unspecified: Secondary | ICD-10-CM

## 2024-10-02 NOTE — Telephone Encounter (Signed)
 I sent this in for patient on 09/26/2024

## 2024-10-10 NOTE — Progress Notes (Unsigned)
 "    10/10/2024 Lindsey Molina [id number] June 22, 1976  Referring provider: Billy Knee, FNP Primary GI doctor: {[id number]}  ASSESSMENT AND PLAN:  + FOBT 07/31/2024  AB bloating 07/30/2024 CT ABPW unremarkable   Hepatic steatosis seen on CT 2025   Patient Care Team: Billy Knee, FNP as PCP - General (Internal Medicine)  HISTORY OF PRESENT ILLNESS: 49 y.o. arabic speaking female with a past medical history listed below presents for evaluation of Ab bloating.   *** Discussed the use of AI scribe software for clinical note transcription with the patient, who gave verbal consent to proceed.  History of Present Illness            She  reports that she has never smoked. She has never used smokeless tobacco. She reports that she does not drink alcohol and does not use drugs.  RELEVANT GI HISTORY, IMAGING AND LABS: Results          CBC    Component Value Date/Time   WBC 4.6 07/31/2024 1216   RBC 4.67 07/31/2024 1216   HGB 12.7 07/31/2024 1216   HGB 12.5 01/20/2022 1022   HGB 12.0 08/20/2010 0923   HCT 38.9 07/31/2024 1216   HCT 34.3 (L) 08/20/2010 0923   PLT 214 07/31/2024 1216   PLT 182 01/20/2022 1022   PLT 176 08/20/2010 0923   MCV 83.3 07/31/2024 1216   MCV 79.7 08/20/2010 0923   MCH 27.2 07/31/2024 1216   MCHC 32.6 07/31/2024 1216   RDW 13.2 07/31/2024 1216   RDW 16.0 (H) 08/20/2010 0923   LYMPHSABS 1.9 04/03/2024 0945   LYMPHSABS 1.6 08/20/2010 0923   MONOABS 0.4 04/03/2024 0945   MONOABS 0.3 08/20/2010 0923   EOSABS 0.1 04/03/2024 0945   EOSABS 0.1 08/20/2010 0923   BASOSABS 0.0 04/03/2024 0945   BASOSABS 0.0 08/20/2010 0923   Recent Labs    04/03/24 0945 07/31/24 1216  HGB 12.5 12.7    CMP     Component Value Date/Time   NA 138 07/31/2024 1216   K 3.8 07/31/2024 1216   CL 104 07/31/2024 1216   CO2 24 07/31/2024 1216   GLUCOSE 95 07/31/2024 1216   GLUCOSE 75 11/27/2015 1137   BUN 12 07/31/2024 1216   CREATININE 0.55 07/31/2024  1216   CREATININE 0.56 01/20/2022 1022   CREATININE 0.38 (L) 09/11/2015 1007   CALCIUM 9.0 07/31/2024 1216   PROT 8.3 (H) 07/31/2024 1216   ALBUMIN 4.6 07/31/2024 1216   AST 24 07/31/2024 1216   AST 19 01/20/2022 1022   ALT 35 07/31/2024 1216   ALT 17 01/20/2022 1022   ALKPHOS 106 07/31/2024 1216   BILITOT 0.4 07/31/2024 1216   BILITOT 0.5 01/20/2022 1022   GFRNONAA >60 07/31/2024 1216   GFRNONAA >60 01/20/2022 1022   GFRAA >60 01/11/2019 1410      Latest Ref Rng & Units 07/31/2024   12:16 PM 04/03/2024    9:45 AM 01/20/2022   10:22 AM  Hepatic Function  Total Protein 6.5 - 8.1 g/dL 8.3  7.5  7.6   Albumin 3.5 - 5.0 g/dL 4.6  4.2  4.1   AST 15 - 41 U/L 24  16  19    ALT 0 - 44 U/L 35  19  17   Alk Phosphatase 38 - 126 U/L 106  73  75   Total Bilirubin 0.0 - 1.2 mg/dL 0.4  0.4  0.5       Current Medications:   Current Outpatient Medications (Endocrine &  Metabolic):    hydrocortisone  (CORTEF ) 10 MG tablet, Take 10 mg by mouth daily.  Current Outpatient Medications (Other):    Cholecalciferol (VITAMIN D -3 PO), Take by mouth.   hydrocortisone  (ANUSOL -HC) 2.5 % rectal cream, Place 1 Application rectally 2 (two) times daily.   Vitamin D , Ergocalciferol , (DRISDOL ) 1.25 MG (50000 UNIT) CAPS capsule, TAKE 1 CAPSULE BY MOUTH EVERY 7 DAYS  Medical History:  Past Medical History:  Diagnosis Date   Anemia of mother in pregnancy, antepartum 07/26/2015   Blood transfusion 07/04/15 at [facility].     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   Hypotension 05/25/2012   Ileus, postoperative (HCC) 02/21/2016   Numbness and tingling 05/25/2012   and pain right arm when I get mad,  nervous and/or blackout; sometimes; not always   Ovarian cyst in pregnancy 07/26/2015   Large right  ovarian cyst 07/04/15     Respiratory arrest (HCC) 05/25/2012   occurs when I black out; I can't breath   Shortness of breath 05/25/2012   occurs when I pass out   Status post repeat low transverse cesarean section 02/18/2016   Supervision of high risk pregnancy, antepartum 07/26/2015            Clinic      High Risk    Prenatal Labs      Dating     by U/S 07/04/15 [redacted]w[redacted]d    Blood type: O/POS/-- (11/17 1527)       Genetic Screen    1 Screen:  NT nml    AFP:  NIPS:  wnl    Antibody:NEG (11/17 1527)      Anatomic US      wnl    Rubella: 10.20 (11/17 1527)      GTT    Early:  169 Third trimester: wnl (75/150/131/108)    RPR: NON REAC (11/17 1527)       Flu vaccine    declined    HBsAg   Syncope and collapse 2011; 05/24/2012   Allergies: Allergies[1]   Surgical History:  She  has a past surgical history that includes Cesarean section (2005; 2011) and Cesarean section (N/A, 02/18/2016). Family History:  Her family history is not on file.  REVIEW OF SYSTEMS  : All other systems reviewed and negative except where noted in the History of Present Illness.  PHYSICAL EXAM: There were no vitals taken for this visit. Physical Exam          Alan JONELLE Coombs, PA-C 9:37 AM      [1] No Known Allergies  "

## 2024-10-11 ENCOUNTER — Ambulatory Visit: Admitting: Physician Assistant
